# Patient Record
Sex: Female | Born: 1985 | Race: Black or African American | Hispanic: No | Marital: Single | Smoking: Never smoker
Health system: Southern US, Community
[De-identification: ages and names within clinical notes are randomized; demographics above are authoritative.]

## PROBLEM LIST (undated history)

## (undated) DIAGNOSIS — J45909 Unspecified asthma, uncomplicated: Secondary | ICD-10-CM

## (undated) DIAGNOSIS — F4310 Post-traumatic stress disorder, unspecified: Secondary | ICD-10-CM

## (undated) DIAGNOSIS — A749 Chlamydial infection, unspecified: Secondary | ICD-10-CM

## (undated) DIAGNOSIS — D649 Anemia, unspecified: Secondary | ICD-10-CM

## (undated) DIAGNOSIS — F329 Major depressive disorder, single episode, unspecified: Secondary | ICD-10-CM

## (undated) DIAGNOSIS — Z3483 Encounter for supervision of other normal pregnancy, third trimester: Secondary | ICD-10-CM

## (undated) DIAGNOSIS — G932 Benign intracranial hypertension: Secondary | ICD-10-CM

## (undated) DIAGNOSIS — Z8619 Personal history of other infectious and parasitic diseases: Secondary | ICD-10-CM

## (undated) DIAGNOSIS — F32A Depression, unspecified: Secondary | ICD-10-CM

## (undated) DIAGNOSIS — F419 Anxiety disorder, unspecified: Secondary | ICD-10-CM

## (undated) HISTORY — DX: Depression, unspecified: F32.A

## (undated) HISTORY — DX: Post-traumatic stress disorder, unspecified: F43.10

## (undated) HISTORY — DX: Major depressive disorder, single episode, unspecified: F32.9

## (undated) HISTORY — DX: Anxiety disorder, unspecified: F41.9

## (undated) HISTORY — PX: TONSILLECTOMY: SUR1361

## (undated) HISTORY — PX: WISDOM TOOTH EXTRACTION: SHX21

## (undated) HISTORY — DX: Personal history of other infectious and parasitic diseases: Z86.19

## (undated) HISTORY — DX: Anemia, unspecified: D64.9

## (undated) HISTORY — PX: DILATION AND CURETTAGE OF UTERUS: SHX78

---
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
## 2005-05-27 ENCOUNTER — Emergency Department: Payer: Self-pay | Admitting: Emergency Medicine

## 2005-08-02 ENCOUNTER — Emergency Department: Payer: Self-pay | Admitting: Emergency Medicine

## 2005-10-16 ENCOUNTER — Emergency Department (HOSPITAL_COMMUNITY): Admission: EM | Admit: 2005-10-16 | Discharge: 2005-10-16 | Payer: Self-pay | Admitting: Emergency Medicine

## 2005-11-26 IMAGING — CR DG CHEST 1V PORT
1 series · 1 of 1 positions shown · non-contrast
Comparison: none

REASON FOR EXAM: Shortness of breath
COMMENTS:

PROCEDURE:     DXR - DXR PORTABLE CHEST SINGLE VIEW  - November 25, 2004  [DATE]
RESULT:        The lungs are clear.  The heart and pulmonary vessels are
normal.  The bony and mediastinal structures are unremarkable.

[view not recorded]
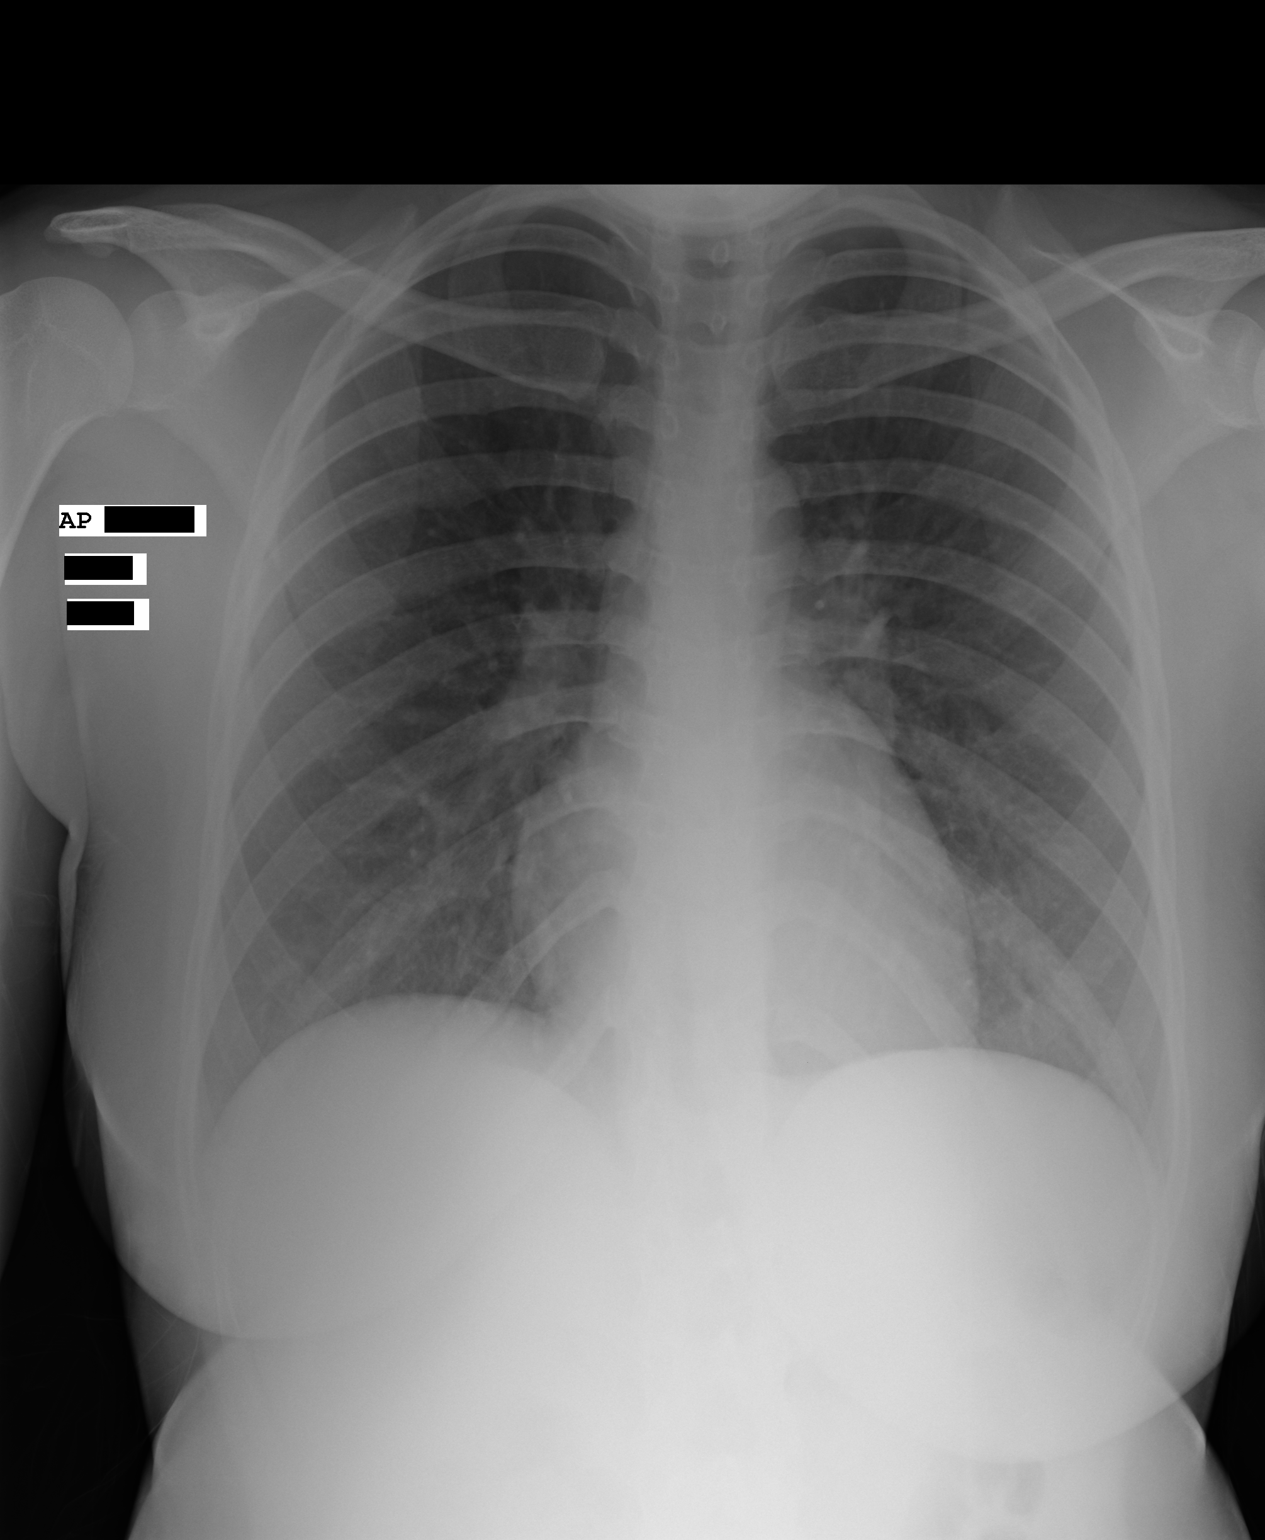

[1 of 1 positions shown; findings below may reference images not displayed]

IMPRESSION: No acute cardiopulmonary disease.

## 2005-12-10 ENCOUNTER — Emergency Department: Payer: Self-pay | Admitting: Emergency Medicine

## 2006-11-07 ENCOUNTER — Emergency Department: Payer: Self-pay | Admitting: Emergency Medicine

## 2007-01-15 ENCOUNTER — Emergency Department: Payer: Self-pay | Admitting: Emergency Medicine

## 2007-03-04 ENCOUNTER — Emergency Department: Payer: Self-pay | Admitting: Emergency Medicine

## 2007-06-08 ENCOUNTER — Emergency Department: Payer: Self-pay | Admitting: Emergency Medicine

## 2007-09-01 ENCOUNTER — Inpatient Hospital Stay (HOSPITAL_COMMUNITY): Admission: AD | Admit: 2007-09-01 | Discharge: 2007-09-02 | Payer: Self-pay | Admitting: Obstetrics and Gynecology

## 2007-12-04 ENCOUNTER — Inpatient Hospital Stay (HOSPITAL_COMMUNITY): Admission: AD | Admit: 2007-12-04 | Discharge: 2007-12-04 | Payer: Self-pay | Admitting: Obstetrics and Gynecology

## 2008-01-09 ENCOUNTER — Inpatient Hospital Stay (HOSPITAL_COMMUNITY): Admission: AD | Admit: 2008-01-09 | Discharge: 2008-01-09 | Payer: Self-pay | Admitting: Obstetrics and Gynecology

## 2008-01-21 ENCOUNTER — Inpatient Hospital Stay (HOSPITAL_COMMUNITY): Admission: AD | Admit: 2008-01-21 | Discharge: 2008-01-21 | Payer: Self-pay | Admitting: Obstetrics and Gynecology

## 2008-02-06 ENCOUNTER — Inpatient Hospital Stay (HOSPITAL_COMMUNITY): Admission: AD | Admit: 2008-02-06 | Discharge: 2008-02-06 | Payer: Self-pay | Admitting: Obstetrics and Gynecology

## 2008-03-15 ENCOUNTER — Inpatient Hospital Stay (HOSPITAL_COMMUNITY): Admission: AD | Admit: 2008-03-15 | Discharge: 2008-03-15 | Payer: Self-pay | Admitting: Obstetrics and Gynecology

## 2008-03-17 ENCOUNTER — Inpatient Hospital Stay (HOSPITAL_COMMUNITY): Admission: AD | Admit: 2008-03-17 | Discharge: 2008-03-18 | Payer: Self-pay | Admitting: Obstetrics and Gynecology

## 2008-03-20 ENCOUNTER — Inpatient Hospital Stay (HOSPITAL_COMMUNITY): Admission: AD | Admit: 2008-03-20 | Discharge: 2008-03-20 | Payer: Self-pay | Admitting: Obstetrics and Gynecology

## 2008-04-10 ENCOUNTER — Inpatient Hospital Stay (HOSPITAL_COMMUNITY): Admission: AD | Admit: 2008-04-10 | Discharge: 2008-04-12 | Payer: Self-pay | Admitting: Obstetrics and Gynecology

## 2008-06-30 ENCOUNTER — Emergency Department: Payer: Self-pay | Admitting: Emergency Medicine

## 2008-11-24 ENCOUNTER — Emergency Department: Payer: Self-pay | Admitting: Internal Medicine

## 2009-06-23 ENCOUNTER — Emergency Department: Payer: Self-pay | Admitting: Emergency Medicine

## 2009-07-15 ENCOUNTER — Emergency Department (HOSPITAL_COMMUNITY): Admission: EM | Admit: 2009-07-15 | Discharge: 2009-07-15 | Payer: Self-pay | Admitting: Emergency Medicine

## 2009-08-06 ENCOUNTER — Encounter (INDEPENDENT_AMBULATORY_CARE_PROVIDER_SITE_OTHER): Payer: Self-pay | Admitting: *Deleted

## 2009-09-03 ENCOUNTER — Emergency Department: Payer: Self-pay | Admitting: Emergency Medicine

## 2010-02-26 ENCOUNTER — Emergency Department: Payer: Self-pay | Admitting: Emergency Medicine

## 2010-03-10 NOTE — Assessment & Plan Note (Signed)
Summary: ER f/u & np,df    Other Orders: No Charge Patient Arrived (NCPA0) ([id number])

## 2010-04-27 LAB — CBC
HCT: 34.5 % — ABNORMAL LOW (ref 36.0–46.0)
Hemoglobin: 11.6 g/dL — ABNORMAL LOW (ref 12.0–15.0)
MCHC: 33.5 g/dL (ref 30.0–36.0)
MCV: 93.1 fL (ref 78.0–100.0)
Platelets: 226 10*3/uL (ref 150–400)
RBC: 3.71 MIL/uL — ABNORMAL LOW (ref 3.87–5.11)
RDW: 13.8 % (ref 11.5–15.5)
WBC: 6.7 10*3/uL (ref 4.0–10.5)

## 2010-04-27 LAB — URINALYSIS, ROUTINE W REFLEX MICROSCOPIC
Ketones, ur: NEGATIVE mg/dL
Nitrite: NEGATIVE
Protein, ur: NEGATIVE mg/dL
Urobilinogen, UA: 0.2 mg/dL (ref 0.0–1.0)
pH: 7 (ref 5.0–8.0)

## 2010-04-27 LAB — DIFFERENTIAL
Basophils Absolute: 0 10*3/uL (ref 0.0–0.1)
Basophils Relative: 0 % (ref 0–1)
Eosinophils Absolute: 0.1 10*3/uL (ref 0.0–0.7)
Eosinophils Relative: 2 % (ref 0–5)
Lymphocytes Relative: 24 % (ref 12–46)
Lymphs Abs: 1.6 10*3/uL (ref 0.7–4.0)
Monocytes Absolute: 0.4 10*3/uL (ref 0.1–1.0)
Monocytes Relative: 6 % (ref 3–12)
Neutro Abs: 4.6 10*3/uL (ref 1.7–7.7)
Neutrophils Relative %: 68 % (ref 43–77)

## 2010-04-27 LAB — BASIC METABOLIC PANEL
BUN: 11 mg/dL (ref 6–23)
CO2: 25 mEq/L (ref 19–32)
Calcium: 8.9 mg/dL (ref 8.4–10.5)
Chloride: 106 mEq/L (ref 96–112)
Creatinine, Ser: 0.8 mg/dL (ref 0.4–1.2)
GFR calc Af Amer: >60 mL/min (ref >60)
GFR calc non Af Amer: >60 mL/min (ref >60)
Glucose, Bld: 89 mg/dL (ref 70–99)
Potassium: 3.9 mEq/L (ref 3.5–5.1)
Sodium: 137 mEq/L (ref 135–145)

## 2010-04-27 LAB — BRAIN NATRIURETIC PEPTIDE: Pro B Natriuretic peptide (BNP): <30 pg/mL (ref 0.0–100.0)

## 2010-04-27 LAB — D-DIMER, QUANTITATIVE: D-Dimer, Quant: <0.22 ug/mL-FEU (ref 0.00–0.48)

## 2010-05-21 LAB — CBC
HCT: 35.4 % — ABNORMAL LOW (ref 36.0–46.0)
Hemoglobin: 11.6 g/dL — ABNORMAL LOW (ref 12.0–15.0)
Hemoglobin: 9.5 g/dL — ABNORMAL LOW (ref 12.0–15.0)
MCHC: 32.7 g/dL (ref 30.0–36.0)
MCV: 91.8 fL (ref 78.0–100.0)
MCV: 93.9 fL (ref 78.0–100.0)
Platelets: 210 10*3/uL (ref 150–400)
RDW: 14.6 % (ref 11.5–15.5)
WBC: 14.5 10*3/uL — ABNORMAL HIGH (ref 4.0–10.5)

## 2010-06-23 NOTE — H&P (Signed)
[name], [name]           ACCOUNT NO.:  [id number]   MEDICAL RECORD NO.:  [id number]          PATIENT TYPE:  INP   LOCATION:  9198                          FACILITY:  [facility]   PHYSICIAN:  Crist Fat. Rivard, M.D. DATE OF BIRTH:  1985/05/30   DATE OF ADMISSION:  04/10/2008  DATE OF DISCHARGE:                              HISTORY & PHYSICAL   NO DICTATION      Vicki L. Emilee Hero, C.N.M.      Crist Fat Rivard, M.D.     Leeanne Mannan  D:  04/10/2008  T:  04/10/2008  Job:  [id number]
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
## 2010-09-07 ENCOUNTER — Ambulatory Visit: Payer: Self-pay | Admitting: Family Medicine

## 2010-10-08 ENCOUNTER — Emergency Department: Payer: Self-pay | Admitting: Emergency Medicine

## 2010-11-06 LAB — WET PREP, GENITAL: Yeast Wet Prep HPF POC: NONE SEEN

## 2010-11-06 LAB — URINE MICROSCOPIC-ADD ON

## 2010-11-06 LAB — URINALYSIS, ROUTINE W REFLEX MICROSCOPIC
Bilirubin Urine: NEGATIVE
Glucose, UA: NEGATIVE
Hgb urine dipstick: NEGATIVE
Ketones, ur: 15 — AB
Protein, ur: NEGATIVE
Urobilinogen, UA: 1

## 2010-11-06 LAB — HCG, QUANTITATIVE, PREGNANCY: hCG, Beta Chain, Quant, S: 147868 — ABNORMAL HIGH

## 2010-11-06 LAB — URINE CULTURE: Colony Count: >=100000

## 2010-11-06 LAB — ABO/RH: ABO/RH(D): A POS

## 2010-11-06 LAB — GC/CHLAMYDIA PROBE AMP, GENITAL
Chlamydia, DNA Probe: POSITIVE — AB
GC Probe Amp, Genital: NEGATIVE

## 2010-11-10 LAB — CBC
MCV: 94.9
Platelets: 251
RBC: 3.38 — ABNORMAL LOW
WBC: 10.9 — ABNORMAL HIGH

## 2010-11-10 LAB — DIFFERENTIAL
Basophils Relative: 0
Eosinophils Absolute: 0.1
Lymphs Abs: 1.1
Monocytes Relative: 5
Neutro Abs: 9 — ABNORMAL HIGH
Neutrophils Relative %: 83 — ABNORMAL HIGH

## 2010-11-13 LAB — STREP B DNA PROBE
Strep Group B Ag: POSITIVE
Strep Group B Ag: POSITIVE

## 2010-11-13 LAB — URINALYSIS, ROUTINE W REFLEX MICROSCOPIC
Bilirubin Urine: NEGATIVE
Glucose, UA: NEGATIVE mg/dL
Glucose, UA: NEGATIVE mg/dL
Hgb urine dipstick: NEGATIVE
Hgb urine dipstick: NEGATIVE
Ketones, ur: NEGATIVE mg/dL
Protein, ur: NEGATIVE mg/dL
Protein, ur: NEGATIVE mg/dL
Urobilinogen, UA: 0.2 mg/dL (ref 0.0–1.0)
Urobilinogen, UA: 0.2 mg/dL (ref 0.0–1.0)

## 2010-11-13 LAB — CBC
HCT: 30.7 % — ABNORMAL LOW (ref 36.0–46.0)
MCHC: 34.1 g/dL (ref 30.0–36.0)
MCV: 93.9 fL (ref 78.0–100.0)
Platelets: 240 10*3/uL (ref 150–400)
WBC: 10.1 10*3/uL (ref 4.0–10.5)

## 2010-11-13 LAB — COMPREHENSIVE METABOLIC PANEL
AST: 19 U/L (ref 0–37)
Albumin: 3 g/dL — ABNORMAL LOW (ref 3.5–5.2)
BUN: 3 mg/dL — ABNORMAL LOW (ref 6–23)
CO2: 24 mEq/L (ref 19–32)
Calcium: 8.5 mg/dL (ref 8.4–10.5)
Chloride: 105 mEq/L (ref 96–112)
Creatinine, Ser: 0.6 mg/dL (ref 0.4–1.2)
GFR calc Af Amer: >60 mL/min (ref >60)
GFR calc non Af Amer: >60 mL/min (ref >60)
Total Bilirubin: 0.4 mg/dL (ref 0.3–1.2)

## 2010-11-13 LAB — WET PREP, GENITAL
Clue Cells Wet Prep HPF POC: NONE SEEN
Trich, Wet Prep: NONE SEEN

## 2010-11-13 LAB — GC/CHLAMYDIA PROBE AMP, GENITAL
Chlamydia, DNA Probe: POSITIVE — AB
GC Probe Amp, Genital: NEGATIVE

## 2010-12-08 ENCOUNTER — Emergency Department: Payer: Self-pay | Admitting: Emergency Medicine

## 2011-06-26 ENCOUNTER — Emergency Department: Payer: Self-pay | Admitting: Emergency Medicine

## 2011-06-27 LAB — URINALYSIS, COMPLETE
Bilirubin,UR: NEGATIVE
Ketone: NEGATIVE
Ph: 7 (ref 4.5–8.0)
Protein: 30
Specific Gravity: 1.028 (ref 1.003–1.030)
WBC UR: 3 /HPF (ref 0–5)

## 2011-06-27 LAB — CBC
HCT: 37 % (ref 35.0–47.0)
HGB: 12.2 g/dL (ref 12.0–16.0)
MCH: 31.1 pg (ref 26.0–34.0)
MCV: 94 fL (ref 80–100)
RBC: 3.92 10*6/uL (ref 3.80–5.20)
RDW: 12.8 % (ref 11.5–14.5)

## 2011-09-03 ENCOUNTER — Ambulatory Visit: Payer: Self-pay | Admitting: Family Medicine

## 2012-04-30 ENCOUNTER — Emergency Department: Payer: Self-pay | Admitting: Emergency Medicine

## 2012-05-31 ENCOUNTER — Emergency Department: Payer: Self-pay | Admitting: Emergency Medicine

## 2012-06-21 ENCOUNTER — Emergency Department: Payer: Self-pay | Admitting: Internal Medicine

## 2012-06-21 LAB — URINALYSIS, COMPLETE
Bilirubin,UR: NEGATIVE
Ketone: NEGATIVE
Nitrite: NEGATIVE
Ph: 6 (ref 4.5–8.0)
RBC,UR: 24 /HPF (ref 0–5)
Squamous Epithelial: 34

## 2012-06-21 LAB — RAPID INFLUENZA A&B ANTIGENS

## 2012-12-29 ENCOUNTER — Encounter (HOSPITAL_COMMUNITY): Payer: Self-pay | Admitting: Emergency Medicine

## 2012-12-29 ENCOUNTER — Emergency Department (HOSPITAL_COMMUNITY)
Admission: EM | Admit: 2012-12-29 | Discharge: 2012-12-29 | Disposition: A | Discharge status: Home / Self Care | Payer: Medicaid Other | Attending: Emergency Medicine | Admitting: Emergency Medicine

## 2012-12-29 DIAGNOSIS — Z3202 Encounter for pregnancy test, result negative: Secondary | ICD-10-CM | POA: Insufficient documentation

## 2012-12-29 DIAGNOSIS — H9209 Otalgia, unspecified ear: Secondary | ICD-10-CM | POA: Insufficient documentation

## 2012-12-29 DIAGNOSIS — J029 Acute pharyngitis, unspecified: Secondary | ICD-10-CM | POA: Insufficient documentation

## 2012-12-29 DIAGNOSIS — N898 Other specified noninflammatory disorders of vagina: Secondary | ICD-10-CM

## 2012-12-29 DIAGNOSIS — J069 Acute upper respiratory infection, unspecified: Secondary | ICD-10-CM | POA: Insufficient documentation

## 2012-12-29 DIAGNOSIS — Z8739 Personal history of other diseases of the musculoskeletal system and connective tissue: Secondary | ICD-10-CM | POA: Insufficient documentation

## 2012-12-29 LAB — URINALYSIS, ROUTINE W REFLEX MICROSCOPIC
Bilirubin Urine: NEGATIVE
Glucose, UA: NEGATIVE mg/dL
Hgb urine dipstick: NEGATIVE
Ketones, ur: NEGATIVE mg/dL
Nitrite: NEGATIVE
Specific Gravity, Urine: 1.031 — ABNORMAL HIGH (ref 1.005–1.030)
pH: 7 (ref 5.0–8.0)

## 2012-12-29 LAB — WET PREP, GENITAL
Clue Cells Wet Prep HPF POC: NONE SEEN
Trich, Wet Prep: NONE SEEN
Yeast Wet Prep HPF POC: NONE SEEN

## 2012-12-29 MED ORDER — CEFTRIAXONE SODIUM 250 MG IJ SOLR
250.0000 mg | Freq: Once | INTRAMUSCULAR | Status: AC
  Administered 2012-12-29: 250 mg via INTRAMUSCULAR
  Filled 2012-12-29: qty 250

## 2012-12-29 MED ORDER — PSEUDOEPHEDRINE HCL ER 120 MG PO TB12: 120.0000 mg | ORAL_TABLET | Freq: Two times a day (BID) | ORAL | Status: DC

## 2012-12-29 MED ORDER — AZITHROMYCIN 250 MG PO TABS
1000.0000 mg | ORAL_TABLET | Freq: Once | ORAL | Status: AC
  Administered 2012-12-29: 1000 mg via ORAL
  Filled 2012-12-29: qty 4

## 2012-12-29 MED ORDER — LIDOCAINE HCL 1 % IJ SOLN
INTRAMUSCULAR | Status: AC
  Filled 2012-12-29: qty 20
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
## 2012-12-29 NOTE — Progress Notes (Signed)
Patient confirms her pcp is Dr. Liane Comber.  System updated.

## 2012-12-29 NOTE — ED Notes (Signed)
Pt states she thinks she came in contact with STD. Wants to be checked for chlamydia. Vaginal discharge is yellow/white. No abdominal pain. Also c/o sore throat, right ear pain, cough.

## 2012-12-30 LAB — GC/CHLAMYDIA PROBE AMP: GC Probe RNA: NEGATIVE

## 2013-01-01 NOTE — ED Provider Notes (Signed)
Medical screening examination/treatment/procedure(s) were performed by non-physician practitioner and as supervising physician I was immediately available for consultation/collaboration.   [name], MD 01/01/13 [phone]

## 2013-06-02 ENCOUNTER — Emergency Department (HOSPITAL_COMMUNITY)
Admission: EM | Admit: 2013-06-02 | Discharge: 2013-06-02 | Disposition: A | Discharge status: Home / Self Care | Payer: Medicaid Other | Attending: Emergency Medicine | Admitting: Emergency Medicine

## 2013-06-02 ENCOUNTER — Encounter (HOSPITAL_COMMUNITY): Payer: Self-pay | Admitting: Emergency Medicine

## 2013-06-02 DIAGNOSIS — Z8739 Personal history of other diseases of the musculoskeletal system and connective tissue: Secondary | ICD-10-CM | POA: Insufficient documentation

## 2013-06-02 DIAGNOSIS — R110 Nausea: Secondary | ICD-10-CM | POA: Insufficient documentation

## 2013-06-02 DIAGNOSIS — R52 Pain, unspecified: Secondary | ICD-10-CM | POA: Insufficient documentation

## 2013-06-02 DIAGNOSIS — Z331 Pregnant state, incidental: Secondary | ICD-10-CM

## 2013-06-02 DIAGNOSIS — J329 Chronic sinusitis, unspecified: Secondary | ICD-10-CM

## 2013-06-02 LAB — POC URINE PREG, ED: Preg Test, Ur: POSITIVE — AB

## 2013-06-02 MED ORDER — ACETAMINOPHEN 500 MG PO TABS: 500.0000 mg | ORAL_TABLET | Freq: Four times a day (QID) | ORAL | Status: DC | PRN

## 2013-06-02 MED ORDER — ONDANSETRON HCL 4 MG PO TABS: 4.0000 mg | ORAL_TABLET | Freq: Four times a day (QID) | ORAL | Status: DC

## 2013-06-02 MED ORDER — FLUTICASONE PROPIONATE 50 MCG/ACT NA SUSP: 2.0000 | Freq: Every day | NASAL | Status: DC

## 2013-06-02 NOTE — ED Notes (Signed)
Pt c/o nasal drainage, sore throat, body aches, chills, nausea.

## 2013-06-02 NOTE — ED Provider Notes (Signed)
CSN: [id number]     Arrival date & time 06/02/13  0143 History   First MD Initiated Contact with Patient 06/02/13 [phone]     Chief Complaint  Patient presents with  . Sore Throat  . Nasal Congestion  . Generalized Body Aches  . Nausea    (Consider location/radiation/quality/duration/timing/severity/associated sxs/prior Treatment) Patient is a 28 y.o. female presenting with URI. The history is provided by the patient. No language interpreter was used.  URI Presenting symptoms: congestion, rhinorrhea and sore throat   Presenting symptoms: no ear pain and no fever   Severity:  Moderate Onset quality:  Gradual Timing:  Constant Progression:  Worsening Chronicity:  New Relieved by:  Nothing Ineffective treatments:  Hot fluids, OTC medications and rest Associated symptoms: myalgias   Risk factors: no recent travel and no sick contacts     Past Medical History  Diagnosis Date  . Arthritis    Past Surgical History  Procedure Laterality Date  . Tonsillectomy     No family history on file. History  Substance Use Topics  . Smoking status: Never Smoker   . Smokeless tobacco: Not on file  . Alcohol Use: Yes     Comment: occ   OB History   Grav Para Term Preterm Abortions TAB SAB Ect Mult Living                   Review of Systems  Constitutional: Positive for chills. Negative for fever.  HENT: Positive for congestion, postnasal drip, rhinorrhea, sinus pressure and sore throat. Negative for ear discharge, ear pain and trouble swallowing.   Respiratory: Negative for shortness of breath.   Gastrointestinal: Positive for nausea. Negative for abdominal pain.  Genitourinary: Negative for vaginal bleeding and vaginal discharge.  Musculoskeletal: Positive for myalgias. Negative for neck stiffness.  Neurological: Negative for syncope.  All other systems reviewed and are negative.    Allergies  Review of patient's allergies indicates no known allergies.  Home Medications    Prior to Admission medications   Not on File   BP 148/97  Pulse 84  Temp(Src) 98.9 F (37.2 C) (Oral)  Resp 18  Ht 5\' 4"  (1.626 m)  SpO2 99%  LMP 05/01/2013  Physical Exam  Nursing note and vitals reviewed. Constitutional: She is oriented to person, place, and time. She appears well-developed and well-nourished. No distress.  Nontoxic/nonseptic appearing  HENT:  Head: Normocephalic and atraumatic.  Right Ear: Hearing, tympanic membrane, external ear and ear canal normal. No mastoid tenderness.  Left Ear: Hearing, tympanic membrane, external ear and ear canal normal. No mastoid tenderness.  Nose: No rhinorrhea. Right sinus exhibits maxillary sinus tenderness and frontal sinus tenderness. Left sinus exhibits maxillary sinus tenderness and frontal sinus tenderness.  Mouth/Throat: Uvula is midline and mucous membranes are normal. Mucous membranes are not dry. No oral lesions. No trismus in the jaw. No uvula swelling. Posterior oropharyngeal erythema (mild) present. No oropharyngeal exudate, posterior oropharyngeal edema or tonsillar abscesses.  +Audible congestion without rhinorrhea  Eyes: Conjunctivae and EOM are normal. No scleral icterus.  Neck: Normal range of motion. Neck supple.  No nuchal rigidity or meningismus  Cardiovascular: Normal rate, regular rhythm and normal heart sounds.   Pulmonary/Chest: Effort normal. No respiratory distress. She has no wheezes. She has no rales.  No tachypnea or dyspnea  Abdominal: Soft. She exhibits no distension. There is no tenderness.  Soft and nontender  Musculoskeletal: Normal range of motion.  Neurological: She is alert and oriented to person, place,  and time.  GCS 15. Speech goal oriented. Moves extremities without ataxia.  Skin: Skin is warm and dry. No rash noted. She is not diaphoretic. No erythema. No pallor.  Psychiatric: She has a normal mood and affect. Her behavior is normal.    ED Course  Procedures (including critical care  time) Labs Review Labs Reviewed  POC URINE PREG, ED - Abnormal; Notable for the following:    Preg Test, Ur POSITIVE (*)    All other components within normal limits    Imaging Review No results found.   EKG Interpretation None      MDM   Final diagnoses:  Sinusitis  Pregnancy as incidental finding    Patient complaining of symptoms of sinusitis. Mild to moderate symptoms of clear/yellow nasal discharge/congestion and scratchy throat with chills and body aches for less than 10 days. Patient is afebrile and nontoxic with stable vital signs. No concern for acute bacterial rhinosinusitis; likely viral in nature. Patient also requested Upreg be completed prior to determining management plan; patient concerned she is pregnant and did not want to take anything harmful should her test be positive. Upreg today is positive. Patient denies abdominal pain, vaginal bleeding, or d/c. Patient discharged with symptomatic treatments safe in pregnancy. Patient instructions given for warm saline nasal washes. Recommendations for follow-up with primary care physician and OBGYN. Patient agreeable to plan with no unaddressed concerns.  Filed Vitals:   06/02/13 0224 06/02/13 0600  BP: 148/97 140/84  Pulse: 84 74  Temp: 98.9 F (37.2 C)   TempSrc: Oral   Resp: 18 18  Height: 5\' 4"  (1.626 m)   SpO2: 99% 99%          Antony MaduraKelly Lalana Wachter, PA-C 06/11/13 2149

## 2013-06-02 NOTE — Discharge Instructions (Signed)
Recommend medications prescribed every for symptomatic management. Also recommend nasal saline sprays or saline rinses for congestion. Follow up with your OB/GYN as your urine pregnancy today was positive. Follow up with your primary care provider as needed to ensure the symptoms resolve. Return if symptoms worsen or if you experience prolonged episodes of sharp abdominal pain, vaginal bleeding, or fever.  Sinusitis Sinusitis is redness, soreness, and swelling (inflammation) of the paranasal sinuses. Paranasal sinuses are air pockets within the bones of your face (beneath the eyes, the middle of the forehead, or above the eyes). In healthy paranasal sinuses, mucus is able to drain out, and air is able to circulate through them by way of your nose. However, when your paranasal sinuses are inflamed, mucus and air can become trapped. This can allow bacteria and other germs to grow and cause infection. Sinusitis can develop quickly and last only a short time (acute) or continue over a long period (chronic). Sinusitis that lasts for more than 12 weeks is considered chronic.  CAUSES  Causes of sinusitis include:  Allergies.  Structural abnormalities, such as displacement of the cartilage that separates your nostrils (deviated septum), which can decrease the air flow through your nose and sinuses and affect sinus drainage.  Functional abnormalities, such as when the small hairs (cilia) that line your sinuses and help remove mucus do not work properly or are not present. SYMPTOMS  Symptoms of acute and chronic sinusitis are the same. The primary symptoms are pain and pressure around the affected sinuses. Other symptoms include:  Upper toothache.  Earache.  Headache.  Bad breath.  Decreased sense of smell and taste.  A cough, which worsens when you are lying flat.  Fatigue.  Fever.  Thick drainage from your nose, which often is green and may contain pus (purulent).  Swelling and warmth over  the affected sinuses. DIAGNOSIS  Your caregiver will perform a physical exam. During the exam, your caregiver may:  Look in your nose for signs of abnormal growths in your nostrils (nasal polyps).  Tap over the affected sinus to check for signs of infection.  View the inside of your sinuses (endoscopy) with a special imaging device with a light attached (endoscope), which is inserted into your sinuses. If your caregiver suspects that you have chronic sinusitis, one or more of the following tests may be recommended:  Allergy tests.  Nasal culture A sample of mucus is taken from your nose and sent to a lab and screened for bacteria.  Nasal cytology A sample of mucus is taken from your nose and examined by your caregiver to determine if your sinusitis is related to an allergy. TREATMENT  Most cases of acute sinusitis are related to a viral infection and will resolve on their own within 10 days. Sometimes medicines are prescribed to help relieve symptoms (pain medicine, decongestants, nasal steroid sprays, or saline sprays).  However, for sinusitis related to a bacterial infection, your caregiver will prescribe antibiotic medicines. These are medicines that will help kill the bacteria causing the infection.  Rarely, sinusitis is caused by a fungal infection. In theses cases, your caregiver will prescribe antifungal medicine. For some cases of chronic sinusitis, surgery is needed. Generally, these are cases in which sinusitis recurs more than 3 times per year, despite other treatments. HOME CARE INSTRUCTIONS   Drink plenty of water. Water helps thin the mucus so your sinuses can drain more easily.  Use a humidifier.  Inhale steam 3 to 4 times a day (for  example, sit in the bathroom with the shower running).  Apply a warm, moist washcloth to your face 3 to 4 times a day, or as directed by your caregiver.  Use saline nasal sprays to help moisten and clean your sinuses.  Take  over-the-counter or prescription medicines for pain, discomfort, or fever only as directed by your caregiver. SEEK IMMEDIATE MEDICAL CARE IF:  You have increasing pain or severe headaches.  You have nausea, vomiting, or drowsiness.  You have swelling around your face.  You have vision problems.  You have a stiff neck.  You have difficulty breathing. MAKE SURE YOU:   Understand these instructions.  Will watch your condition.  Will get help right away if you are not doing well or get worse. Document Released: 01/25/2005 Document Revised: 04/19/2011 Document Reviewed: 02/09/2011 [facility] Patient Information 2014 [place], [place].

## 2013-06-15 NOTE — ED Provider Notes (Signed)
Medical screening examination/treatment/procedure(s) were performed by non-physician practitioner and as supervising physician I was immediately available for consultation/collaboration.   EKG Interpretation None        Enid SkeensJoshua M Brinleigh Tew, MD 06/15/13 1104

## 2013-08-24 ENCOUNTER — Encounter (HOSPITAL_COMMUNITY): Payer: Self-pay | Admitting: Emergency Medicine

## 2013-08-24 ENCOUNTER — Emergency Department (HOSPITAL_COMMUNITY)
Admission: EM | Admit: 2013-08-24 | Discharge: 2013-08-24 | Disposition: A | Discharge status: Home / Self Care | Payer: 59 | Attending: Emergency Medicine | Admitting: Emergency Medicine

## 2013-08-24 DIAGNOSIS — F411 Generalized anxiety disorder: Secondary | ICD-10-CM | POA: Insufficient documentation

## 2013-08-24 DIAGNOSIS — Z792 Long term (current) use of antibiotics: Secondary | ICD-10-CM | POA: Diagnosis not present

## 2013-08-24 DIAGNOSIS — R51 Headache: Secondary | ICD-10-CM | POA: Diagnosis present

## 2013-08-24 DIAGNOSIS — F3289 Other specified depressive episodes: Secondary | ICD-10-CM | POA: Diagnosis not present

## 2013-08-24 DIAGNOSIS — IMO0002 Reserved for concepts with insufficient information to code with codable children: Secondary | ICD-10-CM | POA: Diagnosis not present

## 2013-08-24 DIAGNOSIS — R197 Diarrhea, unspecified: Secondary | ICD-10-CM | POA: Diagnosis not present

## 2013-08-24 DIAGNOSIS — F430 Acute stress reaction: Secondary | ICD-10-CM | POA: Insufficient documentation

## 2013-08-24 DIAGNOSIS — J45901 Unspecified asthma with (acute) exacerbation: Secondary | ICD-10-CM | POA: Diagnosis not present

## 2013-08-24 DIAGNOSIS — Z3202 Encounter for pregnancy test, result negative: Secondary | ICD-10-CM | POA: Insufficient documentation

## 2013-08-24 DIAGNOSIS — F329 Major depressive disorder, single episode, unspecified: Secondary | ICD-10-CM | POA: Diagnosis not present

## 2013-08-24 DIAGNOSIS — R110 Nausea: Secondary | ICD-10-CM | POA: Diagnosis not present

## 2013-08-24 DIAGNOSIS — N898 Other specified noninflammatory disorders of vagina: Secondary | ICD-10-CM | POA: Diagnosis not present

## 2013-08-24 DIAGNOSIS — F439 Reaction to severe stress, unspecified: Secondary | ICD-10-CM

## 2013-08-24 DIAGNOSIS — N390 Urinary tract infection, site not specified: Secondary | ICD-10-CM | POA: Diagnosis not present

## 2013-08-24 HISTORY — DX: Unspecified asthma, uncomplicated: J45.909

## 2013-08-24 LAB — WET PREP, GENITAL
CLUE CELLS WET PREP: NONE SEEN
TRICH WET PREP: NONE SEEN
WBC, Wet Prep HPF POC: NONE SEEN
Yeast Wet Prep HPF POC: NONE SEEN

## 2013-08-24 LAB — COMPREHENSIVE METABOLIC PANEL
ALT: 11 U/L (ref 0–35)
AST: 18 U/L (ref 0–37)
Albumin: 4.6 g/dL (ref 3.5–5.2)
Alkaline Phosphatase: 65 U/L (ref 39–117)
Anion gap: 16 — ABNORMAL HIGH (ref 5–15)
BUN: 11 mg/dL (ref 6–23)
CALCIUM: 9.5 mg/dL (ref 8.4–10.5)
CO2: 22 mEq/L (ref 19–32)
CREATININE: 0.94 mg/dL (ref 0.50–1.10)
Chloride: 101 mEq/L (ref 96–112)
GFR calc non Af Amer: 82 mL/min — ABNORMAL LOW (ref >90)
GLUCOSE: 81 mg/dL (ref 70–99)
Potassium: 3.8 mEq/L (ref 3.7–5.3)
SODIUM: 139 meq/L (ref 137–147)
TOTAL PROTEIN: 8.4 g/dL — AB (ref 6.0–8.3)
Total Bilirubin: 0.8 mg/dL (ref 0.3–1.2)

## 2013-08-24 LAB — URINALYSIS, ROUTINE W REFLEX MICROSCOPIC
Bilirubin Urine: NEGATIVE
Glucose, UA: NEGATIVE mg/dL
Ketones, ur: 15 mg/dL — AB
NITRITE: POSITIVE — AB
PROTEIN: 30 mg/dL — AB
SPECIFIC GRAVITY, URINE: 1.028 (ref 1.005–1.030)
UROBILINOGEN UA: 1 mg/dL (ref 0.0–1.0)
pH: 6 (ref 5.0–8.0)

## 2013-08-24 LAB — CBC
HCT: 39 % (ref 36.0–46.0)
HEMOGLOBIN: 12.8 g/dL (ref 12.0–15.0)
MCH: 30.3 pg (ref 26.0–34.0)
MCHC: 32.8 g/dL (ref 30.0–36.0)
MCV: 92.2 fL (ref 78.0–100.0)
PLATELETS: 307 10*3/uL (ref 150–400)
RBC: 4.23 MIL/uL (ref 3.87–5.11)
RDW: 12.4 % (ref 11.5–15.5)
WBC: 9.9 10*3/uL (ref 4.0–10.5)

## 2013-08-24 LAB — URINE MICROSCOPIC-ADD ON

## 2013-08-24 MED ORDER — ONDANSETRON HCL 4 MG PO TABS: 4.0000 mg | ORAL_TABLET | Freq: Four times a day (QID) | ORAL | Status: DC

## 2013-08-24 MED ORDER — CEPHALEXIN 500 MG PO CAPS: 500.0000 mg | ORAL_CAPSULE | Freq: Four times a day (QID) | ORAL | Status: DC

## 2013-08-24 MED ORDER — SODIUM CHLORIDE 0.9 % IV BOLUS (SEPSIS)
500.0000 mL | Freq: Once | INTRAVENOUS | Status: AC
  Administered 2013-08-24: 500 mL via INTRAVENOUS

## 2013-08-24 MED ORDER — LORAZEPAM 2 MG/ML IJ SOLN
1.0000 mg | Freq: Once | INTRAMUSCULAR | Status: AC
  Administered 2013-08-24: 1 mg via INTRAVENOUS
  Filled 2013-08-24: qty 1

## 2013-08-24 NOTE — Discharge Instructions (Signed)
Take antibiotic to completion for urinary tract infection. Take Zofran as directed as needed for nausea. Followup with your primary care physician to discuss your stress, anxiety and depression. Depression, Adult Depression refers to feeling sad, low, down in the dumps, blue, gloomy, or empty. In general, there are two kinds of depression: 1. Depression that we all experience from time to time because of upsetting life experiences, including the loss of a job or the ending of a relationship (normal sadness or normal grief). This kind of depression is considered normal, is short lived, and resolves within a few days to 2 weeks. (Depression experienced after the loss of a loved one is called bereavement. Bereavement often lasts longer than 2 weeks but normally gets better with time.) 2. Clinical depression, which lasts longer than normal sadness or normal grief or interferes with your ability to function at home, at work, and in school. It also interferes with your personal relationships. It affects almost every aspect of your life. Clinical depression is an illness. Symptoms of depression also can be caused by conditions other than normal sadness and grief or clinical depression. Examples of these conditions are listed as follows:  Physical illness--Some physical illnesses, including underactive thyroid gland (hypothyroidism), severe anemia, specific types of cancer, diabetes, uncontrolled seizures, heart and lung problems, strokes, and chronic pain are commonly associated with symptoms of depression.  Side effects of some prescription medicine--In some people, certain types of prescription medicine can cause symptoms of depression.  Substance abuse--Abuse of alcohol and illicit drugs can cause symptoms of depression. SYMPTOMS Symptoms of normal sadness and normal grief include the following:  Feeling sad or crying for short periods of time.  Not caring about anything (apathy).  Difficulty sleeping  or sleeping too much.  No longer able to enjoy the things you used to enjoy.  Desire to be by oneself all the time (social isolation).  Lack of energy or motivation.  Difficulty concentrating or remembering.  Change in appetite or weight.  Restlessness or agitation. Symptoms of clinical depression include the same symptoms of normal sadness or normal grief and also the following symptoms:  Feeling sad or crying all the time.  Feelings of guilt or worthlessness.  Feelings of hopelessness or helplessness.  Thoughts of suicide or the desire to harm yourself (suicidal ideation).  Loss of touch with reality (psychotic symptoms). Seeing or hearing things that are not real (hallucinations) or having false beliefs about your life or the people around you (delusions and paranoia). DIAGNOSIS  The diagnosis of clinical depression usually is based on the severity and duration of the symptoms. Your caregiver also will ask you questions about your medical history and substance use to find out if physical illness, use of prescription medicine, or substance abuse is causing your depression. Your caregiver also may order blood tests. TREATMENT  Typically, normal sadness and normal grief do not require treatment. However, sometimes antidepressant medicine is prescribed for bereavement to ease the depressive symptoms until they resolve. The treatment for clinical depression depends on the severity of your symptoms but typically includes antidepressant medicine, counseling with a mental health professional, or a combination of both. Your caregiver will help to determine what treatment is best for you. Depression caused by physical illness usually goes away with appropriate medical treatment of the illness. If prescription medicine is causing depression, talk with your caregiver about stopping the medicine, decreasing the dose, or substituting another medicine. Depression caused by abuse of alcohol or  illicit drugs abuse  goes away with abstinence from these substances. Some adults need professional help in order to stop drinking or using drugs. SEEK IMMEDIATE CARE IF:  You have thoughts about hurting yourself or others.  You lose touch with reality (have psychotic symptoms).  You are taking medicine for depression and have a serious side effect. FOR MORE INFORMATION National Alliance on Mental Illness: www.nami.[employer] National Institute of Mental Health: http://www.maynard.net/www.nimh.nih.gov Document Released: 01/23/2000 Document Revised: 07/27/2011 Document Reviewed: 04/26/2011 [facility] Patient Information 2015 [place], [place]. This information is not intended to replace advice given to you by your health care provider. Make sure you discuss any questions you have with your health care provider. Urinary Tract Infection Urinary tract infections (UTIs) can develop anywhere along your urinary tract. Your urinary tract is your body's drainage system for removing wastes and extra water. Your urinary tract includes two kidneys, two ureters, a bladder, and a urethra. Your kidneys are a pair of bean-shaped organs. Each kidney is about the size of your fist. They are located below your ribs, one on each side of your spine. CAUSES Infections are caused by microbes, which are microscopic organisms, including fungi, viruses, and bacteria. These organisms are so small that they can only be seen through a microscope. Bacteria are the microbes that most commonly cause UTIs. SYMPTOMS  Symptoms of UTIs may vary by age and gender of the patient and by the location of the infection. Symptoms in young women typically include a frequent and intense urge to urinate and a painful, burning feeling in the bladder or urethra during urination. Older women and men are more likely to be tired, shaky, and weak and have muscle aches and abdominal pain. A fever may mean the infection is in your kidneys. Other symptoms of a kidney infection include  pain in your back or sides below the ribs, nausea, and vomiting. DIAGNOSIS To diagnose a UTI, your caregiver will ask you about your symptoms. Your caregiver also will ask to provide a urine sample. The urine sample will be tested for bacteria and white blood cells. White blood cells are made by your body to help fight infection. TREATMENT  Typically, UTIs can be treated with medication. Because most UTIs are caused by a bacterial infection, they usually can be treated with the use of antibiotics. The choice of antibiotic and length of treatment depend on your symptoms and the type of bacteria causing your infection. HOME CARE INSTRUCTIONS  If you were prescribed antibiotics, take them exactly as your caregiver instructs you. Finish the medication even if you feel better after you have only taken some of the medication.  Drink enough water and fluids to keep your urine clear or pale yellow.  Avoid caffeine, tea, and carbonated beverages. They tend to irritate your bladder.  Empty your bladder often. Avoid holding urine for long periods of time.  Empty your bladder before and after sexual intercourse.  After a bowel movement, women should cleanse from front to back. Use each tissue only once. SEEK MEDICAL CARE IF:   You have back pain.  You develop a fever.  Your symptoms do not begin to resolve within 3 days. SEEK IMMEDIATE MEDICAL CARE IF:   You have severe back pain or lower abdominal pain.  You develop chills.  You have nausea or vomiting.  You have continued burning or discomfort with urination. MAKE SURE YOU:   Understand these instructions.  Will watch your condition.  Will get help right away if you are not doing well  or get worse. Document Released: 11/04/2004 Document Revised: 07/27/2011 Document Reviewed: 03/05/2011 [facility] Patient Information 2015 [place], [place]. This information is not intended to replace advice given to you by your health care provider. Make  sure you discuss any questions you have with your health care provider. Stress Stress-related medical problems are becoming increasingly common. The body has a built-in physical response to stressful situations. Faced with pressure, challenge or danger, we need to react quickly. Our bodies release hormones such as cortisol and adrenaline to help do this. These hormones are part of the "fight or flight" response and affect the metabolic rate, heart rate and blood pressure, resulting in a heightened, stressed state that prepares the body for optimum performance in dealing with a stressful situation. It is likely that early man required these mechanisms to stay alive, but usually modern stresses do not call for this, and the same hormones released in today's world can damage health and reduce coping ability. CAUSES  Pressure to perform at work, at school or in sports.  Threats of physical violence.  Money worries.  Arguments.  Family conflicts.  Divorce or separation from significant other.  Bereavement.  New job or unemployment.  Changes in location.  Alcohol or drug abuse. SOMETIMES, THERE IS NO PARTICULAR REASON FOR DEVELOPING STRESS. Almost all people are at risk of being stressed at some time in their lives. It is important to know that some stress is temporary and some is long term.  Temporary stress will go away when a situation is resolved. Most people can cope with short periods of stress, and it can often be relieved by relaxing, taking a walk or getting any type of exercise, chatting through issues with friends, or having a good night's sleep.  Chronic (long-term, continuous) stress is much harder to deal with. It can be psychologically and emotionally damaging. It can be harmful both for an individual and for friends and family. SYMPTOMS Everyone reacts to stress differently. There are some common effects that help [place] recognize it. In times of extreme stress, people  may:  Shake uncontrollably.  Breathe faster and deeper than normal (hyperventilate).  Vomit.  For people with asthma, stress can trigger an attack.  For some people, stress may trigger migraine headaches, ulcers, and body pain. PHYSICAL EFFECTS OF STRESS MAY INCLUDE:  Loss of energy.  Skin problems.  Aches and pains resulting from tense muscles, including neck ache, backache and tension headaches.  Increased pain from arthritis and other conditions.  Irregular heart beat (palpitations).  Periods of irritability or anger.  Apathy or depression.  Anxiety (feeling uptight or worrying).  Unusual behavior.  Loss of appetite.  Comfort eating.  Lack of concentration.  Loss of, or decreased, sex-drive.  Increased smoking, drinking, or recreational drug use.  For women, missed periods.  Ulcers, joint pain, and muscle pain. Post-traumatic stress is the stress caused by any serious accident, strong emotional damage, or extremely difficult or violent experience such as rape or war. Post-traumatic stress victims can experience mixtures of emotions such as fear, shame, depression, guilt or anger. It may include recurrent memories or images that may be haunting. These feelings can last for weeks, months or even years after the traumatic event that triggered them. Specialized treatment, possibly with medicines and psychological therapies, is available. If stress is causing physical symptoms, severe distress or making it difficult for you to function as normal, it is worth seeing your caregiver. It is important to remember that although stress is a  usual part of life, extreme or prolonged stress can lead to other illnesses that will need treatment. It is better to visit a doctor sooner rather than later. Stress has been linked to the development of high blood pressure and heart disease, as well as insomnia and depression. There is no diagnostic test for stress since everyone reacts to  it differently. But a caregiver will be able to spot the physical symptoms, such as:  Headaches.  Shingles.  Ulcers. Emotional distress such as intense worry, low mood or irritability should be detected when the doctor asks pertinent questions to identify any underlying problems that might be the cause. In case there are physical reasons for the symptoms, the doctor may also want to do some tests to exclude certain conditions. If you feel that you are suffering from stress, try to identify the aspects of your life that are causing it. Sometimes you may not be able to change or avoid them, but even a small change can have a positive ripple effect. A simple lifestyle change can make all the difference. STRATEGIES THAT CAN HELP DEAL WITH STRESS:  Delegating or sharing responsibilities.  Avoiding confrontations.  Learning to be more assertive.  Regular exercise.  Avoid using alcohol or street drugs to cope.  Eating a healthy, balanced diet, rich in fruit and vegetables and proteins.  Finding humor or absurdity in stressful situations.  Never taking on more than you know you can handle comfortably.  Organizing your time better to get as much done as possible.  Talking to friends or family and sharing your thoughts and fears.  Listening to music or relaxation tapes.  Relaxation techniques like deep breathing, meditation, and yoga.  Tensing and then relaxing your muscles, starting at the toes and working up to the head and neck. If you think that you would benefit from help, either in identifying the things that are causing your stress or in learning techniques to help you relax, see a caregiver who is capable of helping you with this. Rather than relying on medications, it is usually better to try and identify the things in your life that are causing stress and try to deal with them. There are many techniques of managing stress including counseling, psychotherapy, aromatherapy, yoga,  and exercise. Your caregiver can help you determine what is best for you. Document Released: 04/17/2002 Document Revised: 01/30/2013 Document Reviewed: 03/14/2007 [facility] Patient Information 2015 [place], [place]. This information is not intended to replace advice given to you by your health care provider. Make sure you discuss any questions you have with your health care provider.

## 2013-08-24 NOTE — ED Notes (Signed)
Pt states "I feel like I've been hit by a bus". Pt states she has shortness of breath, decreased appetite, nausea, night sweats, fatigue x one month. Pt states she has had diarrhea since yesterday. Pt state she also has a headache and "forgets things." Pt alert, no acute distress. Skin warm and dry.

## 2013-08-24 NOTE — ED Provider Notes (Signed)
Medical screening examination/treatment/procedure(s) were performed by non-physician practitioner and as supervising physician I was immediately available for consultation/collaboration.   EKG Interpretation None        Kristen N Ward, DO 08/24/13 2301 

## 2013-08-24 NOTE — ED Provider Notes (Signed)
CSN: [id number]     Arrival date & time 08/24/13  1528 History   First MD Initiated Contact with Patient 08/24/13 1615     Chief Complaint  Patient presents with  . Headache  . Nausea     (Consider location/radiation/quality/duration/timing/severity/associated sxs/prior Treatment) HPI Comments: 28 year old female with a past medical history asthma presents to the emergency department with multiple complaints. Patient states she "feels like I've been hit by a bus". States she has had a decreased appetite x5 days with associated nausea and fatigue. The last time she ate something was Monday evening, states she has been sipping on small amounts of fluids over the past few days causing her to dry heave. States she feels overwhelmed and may be depressed and stressed. She lives at home with her mom she does not get along with very well. The only thing she looks forward to eat she is going to school. States she has generalized abdominal pain, worse in her suprapubic area. Admits to increased urinary frequency, urgency and "very yellow" urine with an odor. This morning she noticed white vaginal discharge. She is sexually active with one partner and uses protection. Patient also states she feels very forgetful recently. States she'll walk into one room and cannot remember why she went in there. States she has bilateral temporal headache that has been intermittent over the past month. She feels like she had a fever over the past few days but does not have a thermometer. She had an episode of diarrhea yesterday and today. Diarrhea is nonbloody. She also feels like she cannot take a deep breath, worse when she is laying flat. She tried using her daughter's nebulizer treatment with no relief. No wheezing. Denies any chest pain. She thinks this may be due to her stress. LMP was in May, she had a Nexplanon placed 1 month ago.  Patient is a 28 y.o. female presenting with headaches. The history is provided by the  patient.  Headache Associated symptoms: abdominal pain, diarrhea, fatigue, fever and nausea     Past Medical History  Diagnosis Date  . Asthma    Past Surgical History  Procedure Laterality Date  . Tonsillectomy     No family history on file. History  Substance Use Topics  . Smoking status: Never Smoker   . Smokeless tobacco: Not on file  . Alcohol Use: Yes     Comment: occ   OB History   Grav Para Term Preterm Abortions TAB SAB Ect Mult Living                 Review of Systems  Constitutional: Positive for fever and fatigue.  Respiratory: Positive for shortness of breath.   Gastrointestinal: Positive for nausea, abdominal pain and diarrhea.  Genitourinary: Positive for dysuria, urgency, frequency and vaginal discharge.  Neurological: Positive for headaches.  All other systems reviewed and are negative.     Allergies  Review of patient's allergies indicates no known allergies.  Home Medications   Prior to Admission medications   Medication Sig Start Date End Date Taking? Authorizing Provider  acetaminophen (TYLENOL) 500 MG tablet Take 1 tablet (500 mg total) by mouth every 6 (six) hours as needed. 06/02/13  Yes Antony Madura, PA-C  fluticasone (FLONASE) 50 MCG/ACT nasal spray Place 2 sprays into both nostrils daily. 06/02/13  Yes Antony Madura, PA-C  cephALEXin (KEFLEX) 500 MG capsule Take 1 capsule (500 mg total) by mouth 4 (four) times daily. x5 days 08/24/13   Trevor Mace,  PA-C  ondansetron (ZOFRAN) 4 MG tablet Take 1 tablet (4 mg total) by mouth every 6 (six) hours. 08/24/13   Trevor Mace, PA-C   BP 140/89  Pulse 66  Temp(Src) 98.7 F (37.1 C) (Oral)  Resp 18  SpO2 100%  LMP 06/24/2013 Physical Exam  Nursing note and vitals reviewed. Constitutional: She is oriented to person, place, and time. She appears well-developed and well-nourished. No distress.  HENT:  Head: Normocephalic and atraumatic.  Mouth/Throat: Oropharynx is clear and moist.  Eyes:  Conjunctivae and EOM are normal. Pupils are equal, round, and reactive to light. No scleral icterus.  Neck: Normal range of motion. Neck supple.  Cardiovascular: Normal rate, regular rhythm and normal heart sounds.   Pulmonary/Chest: Effort normal and breath sounds normal. No respiratory distress. She has no wheezes. She has no rales. She exhibits no tenderness.  Abdominal: Soft. Bowel sounds are normal. She exhibits no distension.  Mild suprapubic tenderness. No peritoneal signs. No CVA tenderness.  Genitourinary: Uterus normal. Cervix exhibits no motion tenderness, no discharge and no friability. Right adnexum displays no mass, no tenderness and no fullness. Left adnexum displays no mass, no tenderness and no fullness. No erythema, tenderness or bleeding around the vagina. Vaginal discharge (scant, white) found.  Musculoskeletal: Normal range of motion. She exhibits no edema.  Neurological: She is alert and oriented to person, place, and time. She has normal strength. No cranial nerve deficit or sensory deficit. Coordination and gait normal.  Speech fluent, goal oriented. Moves limbs without ataxia. Equal grip strength bilateral.  Skin: Skin is warm and dry. She is not diaphoretic.  Psychiatric: Her speech is normal and behavior is normal. Her mood appears anxious. She exhibits a depressed mood. She expresses no homicidal and no suicidal ideation.  Tearful.    ED Course  Procedures (including critical care time) Labs Review Labs Reviewed  COMPREHENSIVE METABOLIC PANEL - Abnormal; Notable for the following:    Total Protein 8.4 (*)    GFR calc non Af Amer 82 (*)    Anion gap 16 (*)    All other components within normal limits  URINALYSIS, ROUTINE W REFLEX MICROSCOPIC - Abnormal; Notable for the following:    Color, Urine AMBER (*)    APPearance CLOUDY (*)    Hgb urine dipstick SMALL (*)    Ketones, ur 15 (*)    Protein, ur 30 (*)    Nitrite POSITIVE (*)    Leukocytes, UA TRACE (*)     All other components within normal limits  URINE MICROSCOPIC-ADD ON - Abnormal; Notable for the following:    Bacteria, UA MANY (*)    All other components within normal limits  WET PREP, GENITAL  GC/CHLAMYDIA PROBE AMP  CBC  POC URINE PREG, ED    Imaging Review No results found.   EKG Interpretation None      MDM   Final diagnoses:  Urinary tract infection without hematuria, site unspecified  Stress    patient presenting with multiple complaints. She is well appearing and in no apparent distress. Afebrile, vital signs stable. She seems depressed and is tearful. I believe most of her symptoms are related to stress, depression and anxiety. Plan to obtain labs, urine, check for pregnancy and do pelvic exam. 7:07 PM Urine positive for UTI, nitrate positive, trace leukocytes and many bacteria. Will treat with Keflex. The rest of her workup is negative. Patient reports she is feeling better after receiving Ativan. Advised her to followup with her PCP  to discuss her increased stress, anxiety and depression. Stable for discharge. Return precautions given. Patient states understanding of treatment care plan and is agreeable.   Trevor MaceRobyn M Albert, PA-C 08/24/13 [address] [id number]

## 2013-08-25 LAB — GC/CHLAMYDIA PROBE AMP
CT Probe RNA: NEGATIVE
GC PROBE AMP APTIMA: NEGATIVE

## 2013-11-24 ENCOUNTER — Emergency Department (HOSPITAL_COMMUNITY): Payer: Medicaid Other

## 2013-11-24 ENCOUNTER — Emergency Department (HOSPITAL_COMMUNITY)
Admission: EM | Admit: 2013-11-24 | Discharge: 2013-11-24 | Disposition: A | Discharge status: Home / Self Care | Payer: Medicaid Other | Attending: Emergency Medicine | Admitting: Emergency Medicine

## 2013-11-24 DIAGNOSIS — R1031 Right lower quadrant pain: Secondary | ICD-10-CM | POA: Insufficient documentation

## 2013-11-24 DIAGNOSIS — Z3202 Encounter for pregnancy test, result negative: Secondary | ICD-10-CM | POA: Diagnosis not present

## 2013-11-24 DIAGNOSIS — J45909 Unspecified asthma, uncomplicated: Secondary | ICD-10-CM | POA: Diagnosis not present

## 2013-11-24 DIAGNOSIS — Z7951 Long term (current) use of inhaled steroids: Secondary | ICD-10-CM | POA: Insufficient documentation

## 2013-11-24 DIAGNOSIS — N939 Abnormal uterine and vaginal bleeding, unspecified: Secondary | ICD-10-CM | POA: Insufficient documentation

## 2013-11-24 DIAGNOSIS — R1032 Left lower quadrant pain: Secondary | ICD-10-CM | POA: Diagnosis not present

## 2013-11-24 DIAGNOSIS — K5900 Constipation, unspecified: Secondary | ICD-10-CM | POA: Insufficient documentation

## 2013-11-24 DIAGNOSIS — R109 Unspecified abdominal pain: Secondary | ICD-10-CM

## 2013-11-24 DIAGNOSIS — R102 Pelvic and perineal pain: Secondary | ICD-10-CM

## 2013-11-24 LAB — URINALYSIS, ROUTINE W REFLEX MICROSCOPIC
Bilirubin Urine: NEGATIVE
GLUCOSE, UA: NEGATIVE mg/dL
Ketones, ur: NEGATIVE mg/dL
LEUKOCYTES UA: NEGATIVE
Nitrite: NEGATIVE
PH: 5.5 (ref 5.0–8.0)
Protein, ur: NEGATIVE mg/dL
Specific Gravity, Urine: 1.036 — ABNORMAL HIGH (ref 1.005–1.030)
Urobilinogen, UA: 0.2 mg/dL (ref 0.0–1.0)

## 2013-11-24 LAB — BASIC METABOLIC PANEL
Anion gap: 14 (ref 5–15)
BUN: 16 mg/dL (ref 6–23)
CALCIUM: 9.3 mg/dL (ref 8.4–10.5)
CO2: 24 mEq/L (ref 19–32)
Chloride: 105 mEq/L (ref 96–112)
Creatinine, Ser: 0.95 mg/dL (ref 0.50–1.10)
GFR calc Af Amer: >90 mL/min (ref >90)
GFR calc non Af Amer: 81 mL/min — ABNORMAL LOW (ref >90)
GLUCOSE: 67 mg/dL — AB (ref 70–99)
POTASSIUM: 4 meq/L (ref 3.7–5.3)
Sodium: 143 mEq/L (ref 137–147)

## 2013-11-24 LAB — CBC
HEMATOCRIT: 38.8 % (ref 36.0–46.0)
HEMOGLOBIN: 12.5 g/dL (ref 12.0–15.0)
MCH: 30.2 pg (ref 26.0–34.0)
MCHC: 32.2 g/dL (ref 30.0–36.0)
MCV: 93.7 fL (ref 78.0–100.0)
Platelets: 264 10*3/uL (ref 150–400)
RBC: 4.14 MIL/uL (ref 3.87–5.11)
RDW: 13.2 % (ref 11.5–15.5)
WBC: 9.1 10*3/uL (ref 4.0–10.5)

## 2013-11-24 LAB — POC URINE PREG, ED: Preg Test, Ur: NEGATIVE

## 2013-11-24 LAB — WET PREP, GENITAL
Clue Cells Wet Prep HPF POC: NONE SEEN
Trich, Wet Prep: NONE SEEN
WBC, Wet Prep HPF POC: NONE SEEN
Yeast Wet Prep HPF POC: NONE SEEN

## 2013-11-24 LAB — URINE MICROSCOPIC-ADD ON

## 2013-11-24 MED ORDER — HYDROCODONE-ACETAMINOPHEN 5-325 MG PO TABS
1.0000 | ORAL_TABLET | Freq: Once | ORAL | Status: AC
  Administered 2013-11-24: 1 via ORAL
  Filled 2013-11-24: qty 1

## 2013-11-24 MED ORDER — NAPROXEN 500 MG PO TABS
500.0000 mg | ORAL_TABLET | Freq: Once | ORAL | Status: AC
  Administered 2013-11-24: 500 mg via ORAL
  Filled 2013-11-24: qty 1

## 2013-11-24 MED ORDER — MEFENAMIC ACID 250 MG PO CAPS: 500.0000 mg | ORAL_CAPSULE | Freq: Three times a day (TID) | ORAL | Status: DC

## 2013-11-24 MED ORDER — HYDROCODONE-ACETAMINOPHEN 5-325 MG PO TABS: 1.0000 | ORAL_TABLET | Freq: Four times a day (QID) | ORAL | Status: DC | PRN

## 2013-11-24 NOTE — ED Notes (Signed)
PA at bedside.

## 2013-11-24 NOTE — ED Notes (Signed)
Per pt, states vaginal cramping and bleeding since yesterday-on implant-never happened before, this is her 3rd implant

## 2013-11-24 NOTE — ED Provider Notes (Signed)
CSN: [id number]     Arrival date & time 11/24/13  1651 History   First MD Initiated Contact with Patient 11/24/13 1701     Chief Complaint  Patient presents with  . Vaginal Bleeding     (Consider location/radiation/quality/duration/timing/severity/associated sxs/prior Treatment) HPI Comments: [name] is a 28 y.o. G2P1 female with a PMHx of elective Ab and asthma, who presents to the ED with complaints of vaginal bleeding and lower abd cramping x1 day. Abd cramping is 6/10 constant, aching, radiating some to the back, worse with stretching, and with no known alleviating factors. Vaginal bleeding is less than menses, going through one pad all day, no clots, and bright red. States her urine is darker and has some dysuria but denies malodorous urine. Endorses some constipation x2 days. Had nexplanon implanted for the 3rd time, performed in June 2015. Never had any issues in the past with breakthrough bleeding, does not get menses since starting nexplanon. Sexually active with one partner, unprotected. Denies fevers, chills, CP, SOB, dizziness, syncope, lightheadedness, upper abd pain, n/v/d, obstipation, myalgias, arthralgias, flank pain, vaginal discharge, paresthesias or weakness. Denies rashes or genital lesions.   Patient is a 28 y.o. female presenting with vaginal bleeding. The history is provided by the patient. No language interpreter was used.  Vaginal Bleeding Quality:  Bright red Severity:  Mild Onset quality:  Sudden Duration:  1 day Timing:  Constant Progression:  Unchanged Chronicity:  New Menstrual history:  Irregular (on nexplanon, does not get menses, never has breakthrough bleeding) Number of pads used:  1 Number of tampons used:  0 Possible pregnancy: no   Context: spontaneously   Relieved by:  None tried Worsened by:  Nothing tried Ineffective treatments:  None tried Associated symptoms: back pain and dysuria   Associated symptoms: no abdominal pain, no  dizziness, no dyspareunia, no fatigue, no fever, no nausea and no vaginal discharge   Risk factors: terminated pregnancy and unprotected sex   Risk factors: no bleeding disorder, no hx of ectopic pregnancy, no gynecological surgery, does not have multiple partners and no prior miscarriage     Past Medical History  Diagnosis Date  . Asthma    Past Surgical History  Procedure Laterality Date  . Tonsillectomy     No family history on file. History  Substance Use Topics  . Smoking status: Never Smoker   . Smokeless tobacco: Not on file  . Alcohol Use: Yes     Comment: occ   OB History   Grav Para Term Preterm Abortions TAB SAB Ect Mult Living                 Review of Systems  Constitutional: Negative for fever, chills and fatigue.  Respiratory: Negative for shortness of breath.   Cardiovascular: Negative for chest pain.  Gastrointestinal: Positive for constipation. Negative for nausea, abdominal pain, diarrhea, blood in stool, abdominal distention and rectal pain.  Genitourinary: Positive for dysuria, vaginal bleeding, menstrual problem and pelvic pain. Negative for urgency, frequency, hematuria, flank pain, decreased urine volume, vaginal discharge, difficulty urinating, vaginal pain and dyspareunia.  Musculoskeletal: Positive for back pain. Negative for arthralgias and myalgias.  Skin: Negative for color change and rash.  Neurological: Negative for dizziness, syncope, weakness, light-headedness and headaches.   10 Systems reviewed and are negative for acute change except as noted in the HPI.    Allergies  Review of patient's allergies indicates no known allergies.  Home Medications   Prior to Admission medications  Medication Sig Start Date End Date Taking? Authorizing Provider  acetaminophen (TYLENOL) 500 MG tablet Take 1 tablet (500 mg total) by mouth every 6 (six) hours as needed. 06/02/13   Antony MaduraKelly Humes, PA-C  cephALEXin (KEFLEX) 500 MG capsule Take 1 capsule (500 mg  total) by mouth 4 (four) times daily. x5 days 08/24/13   Kathrynn Speedobyn M Hess, PA-C  fluticasone [facility](FLONASE) 50 MCG/ACT nasal spray Place 2 sprays into both nostrils daily. 06/02/13   Antony MaduraKelly Humes, PA-C  ondansetron (ZOFRAN) 4 MG tablet Take 1 tablet (4 mg total) by mouth every 6 (six) hours. 08/24/13   Robyn M Hess, PA-C   BP 137/90  Pulse 73  Resp 18  SpO2 100% Physical Exam  Nursing note and vitals reviewed. Constitutional: She is oriented to person, place, and time. Vital signs are normal. She appears well-developed and well-nourished.  Non-toxic appearance. No distress.  Afebrile, VSS, NAD, nontoxic  HENT:  Head: Normocephalic and atraumatic.  Mouth/Throat: Mucous membranes are normal.  Eyes: Conjunctivae and EOM are normal. Right eye exhibits no discharge. Left eye exhibits no discharge.  Neck: Normal range of motion. Neck supple.  Cardiovascular: Normal rate, regular rhythm, normal heart sounds and intact distal pulses.  Exam reveals no gallop and no friction rub.   No murmur heard. Pulmonary/Chest: Effort normal and breath sounds normal. No respiratory distress. She has no decreased breath sounds. She has no wheezes. She has no rhonchi. She has no rales.  Abdominal: Soft. Normal appearance and bowel sounds are normal. She exhibits no distension. There is tenderness in the right lower quadrant, suprapubic area and left lower quadrant. There is no rigidity, no rebound, no guarding, no CVA tenderness and no tenderness at McBurney's point.    Soft, nondistended, +BS throughout, TTP in lower abd along pelvic brim, no r/g/r, neg murphy's, neg mcburney's, no CVA TTP  Genitourinary: Pelvic exam was performed with patient supine. There is no rash, tenderness, lesion or injury on the right labia. There is no rash, tenderness, lesion or injury on the left labia. Uterus is tender. Uterus is not deviated, not enlarged and not fixed. Cervix exhibits motion tenderness (difficult to assess due to diffuse  tenderness) and discharge (bleeding). Cervix exhibits no friability. Right adnexum displays tenderness. Right adnexum displays no mass and no fullness. Left adnexum displays tenderness. Left adnexum displays no mass and no fullness. There is bleeding around the vagina. No erythema or tenderness around the vagina. No signs of injury around the vagina. No vaginal discharge found.  No rashes, lesions, or tenderness to external genitalia. No erythema, injury, or tenderness to vaginal mucosa. Bright red blood in vaginal vault, coming from cervical os. No vaginal discharge within vaginal vault. No adnexal masses or fullness but diffuse adnexal tenderness bilaterally as well as uterine tenderness. Difficult to assess CMT given diffuse tenderness to uterus and adnexa, no cervical friability or discharge from cervical os but bright red blood coming from cervical os which is closed. Uterus non-deviated, mobile, and without enlargement.   Musculoskeletal: Normal range of motion.  Neurological: She is alert and oriented to person, place, and time. She has normal strength. No sensory deficit.  Skin: Skin is warm, dry and intact. No rash noted.  Psychiatric: She has a normal mood and affect.    ED Course  Procedures (including critical care time) Labs Review Labs Reviewed  URINALYSIS, ROUTINE W REFLEX MICROSCOPIC - Abnormal; Notable for the following:    APPearance CLOUDY (*)    Specific Gravity, Urine 1.036 (*)  Hgb urine dipstick LARGE (*)    All other components within normal limits  BASIC METABOLIC PANEL - Abnormal; Notable for the following:    Glucose, Bld 67 (*)    GFR calc non Af Amer 81 (*)    All other components within normal limits  WET PREP, GENITAL  GC/CHLAMYDIA PROBE AMP  CBC  URINE MICROSCOPIC-ADD ON  RPR  HIV ANTIBODY (ROUTINE TESTING)  POC URINE PREG, ED    Imaging Review US Transvaginal Non-ob  11/24/2013   CLINICAL DATA:  Vaginal bleeding and pain  EXAM: TRANSABDOMINAL AND  TRANSVAGINAL ULTRASOUND OF PELVIS  TECHNIQUE: Both transabdominal and transvaginal ultrasound examinations of the pelvis were performed. Transabdominal technique was performed for global imaging of the pelvis including uterus, ovaries, adnexal regions, and pelvic cul-de-sac. It was necessary to proceed with endovaginal exam following the transabdominal exam to visualize the uterus and ovaries.  COMPARISON:  None  FINDINGS: Uterus  Measurements: 8 x 4 x 4 cm. No fibroids or other mass visualized.  Endometrium  Thickness: 3 mm.  No focal abnormality visualized.  Right ovary  Measurements: 32 x 20 x 26 mm. Normal appearance/no adnexal mass.  Left ovary  Measurements: 33 x 22 x 27 mm. Normal appearance/no adnexal mass.  Other findings  Trace free fluid.  Arterial and venous flow identified.  IMPRESSION: Trace free fluid, otherwise negative.   Electronically Signed   By: Esperanza Heir M.D.   On: 11/24/2013 20:52   US Pelvis Complete  11/24/2013   CLINICAL DATA:  Vaginal bleeding and pain  EXAM: TRANSABDOMINAL AND TRANSVAGINAL ULTRASOUND OF PELVIS  TECHNIQUE: Both transabdominal and transvaginal ultrasound examinations of the pelvis were performed. Transabdominal technique was performed for global imaging of the pelvis including uterus, ovaries, adnexal regions, and pelvic cul-de-sac. It was necessary to proceed with endovaginal exam following the transabdominal exam to visualize the uterus and ovaries.  COMPARISON:  None  FINDINGS: Uterus  Measurements: 8 x 4 x 4 cm. No fibroids or other mass visualized.  Endometrium  Thickness: 3 mm.  No focal abnormality visualized.  Right ovary  Measurements: 32 x 20 x 26 mm. Normal appearance/no adnexal mass.  Left ovary  Measurements: 33 x 22 x 27 mm. Normal appearance/no adnexal mass.  Other findings  Trace free fluid.  Arterial and venous flow identified.  IMPRESSION: Trace free fluid, otherwise negative.   Electronically Signed   By: Esperanza Heir M.D.   On: 11/24/2013  20:52     EKG Interpretation None      MDM   Final diagnoses:  Vaginal bleeding  Abdominal cramping    28y/o female with vaginal bleeding and cramps. Recently had nexplanon implanted, could be breakthrough bleeding. Pelvic exam with diffuse tenderness therefore will obtain ultrasound. U/A and labs pending. Will given vicodin for pain and reassess shortly. Will likely give empiric treatment for infection given such diffuse tenderness, but will await U/S first.  7:27 PM CBC WNL, BMP WNL, U/A with blood and some WBC but doubt this is UTI given rare bacteria and mucous present. Wet prep unremarkable. Awaiting U/S. Doubt need for PID tx given no abnormal findings on u/s and setting of painful bleeding. Will not empirically treat for infection.  9:45 PM U/S without any abnormalities. I believe this is breakthrough bleeding. Pt not having severe anemia, will give naprosyn now and start mefenamic acid w/ follow up at [facility] on Monday. I explained the diagnosis and have given explicit precautions to return to the ER including  for any other new or worsening symptoms. The patient understands and accepts the medical plan as it's been dictated and I have answered their questions. Discharge instructions concerning home care and prescriptions have been given. The patient is STABLE and is discharged to home in good condition.  BP 129/92  Pulse 79  Temp(Src) 98.8 F (37.1 C) (Oral)  Resp 16  SpO2 99%  Meds ordered this encounter  Medications  . HYDROcodone-acetaminophen (NORCO/VICODIN) 5-325 MG per tablet 1 tablet    Sig:   . naproxen (NAPROSYN) tablet 500 mg    Sig:   . Mefenamic Acid 250 MG CAPS    Sig: Take 2 capsules (500 mg total) by mouth 3 (three) times daily. X 4 days or until bleeding stops    Dispense:  24 each    Refill:  0    Order Specific Question:  Supervising Provider    Answer:  Eber Hong D [[id number]]  . HYDROcodone-acetaminophen (NORCO) 5-325 MG per tablet    Sig: Take 1-2  tablets by mouth every 6 (six) hours as needed for severe pain.    Dispense:  6 tablet    Refill:  0    Order Specific Question:  Supervising Provider    Answer:  Vida Roller [address] Ahlquist Camprubi-Soms, PA-C 11/24/13 2147

## 2013-11-24 NOTE — ED Notes (Signed)
Pt reporting that she has the Nexplanon birth control implant (right inner upper arm) since June. She began having  Bright red blood yesterday. She also states it's not heavy. She c/o pelvic pain as well that started yesterday. She states she has only went through one pad today.

## 2013-11-24 NOTE — ED Provider Notes (Signed)
Medical screening examination/treatment/procedure(s) were performed by non-physician practitioner and as supervising physician I was immediately available for consultation/collaboration.   EKG Interpretation None       Arby BarretteMarcy Mozetta Murfin, MD 11/24/13 2328

## 2013-11-24 NOTE — Discharge Instructions (Signed)
Your pain and cramping is from breakthrough bleeding. Use mefenamic acid as directed until bleeding stops, and use norco for breakthrough pain. Don't drive while taking this. See your OBGYN on Monday for follow up. Return to the ER for changes or worsening symptoms.    Abnormal Uterine Bleeding Abnormal uterine bleeding means bleeding from the vagina that is not your normal menstrual period. This can be:  Bleeding or spotting between periods.  Bleeding after sex (sexual intercourse).  Bleeding that is heavier or more than normal.  Periods that last longer than usual.  Bleeding after menopause. There are many problems that may cause this. Treatment will depend on the cause of the bleeding. Any kind of bleeding that is not normal should be reviewed by your doctor.  HOME CARE Watch your condition for any changes. These actions may lessen any discomfort you are having:  Do not use tampons or douches as told by your doctor.  Change your pads often. You should get regular pelvic exams and Pap tests. Keep all appointments for tests as told by your doctor. GET HELP IF:  You are bleeding for more than 1 week.  You feel dizzy at times. GET HELP RIGHT AWAY IF:   You pass out.  You have to change pads every 15 to 30 minutes.  You have belly pain.  You have a fever.  You become sweaty or weak.  You are passing large blood clots from the vagina.  You feel sick to your stomach (nauseous) and throw up (vomit). MAKE SURE YOU:  Understand these instructions.  Will watch your condition.  Will get help right away if you are not doing well or get worse. Document Released: 11/22/2008 Document Revised: 01/30/2013 Document Reviewed: 08/24/2012 [facility] Patient Information 2015 [place], [place]. This information is not intended to replace advice given to you by your health care provider. Make sure you discuss any questions you have with your health care provider.  Pelvic Pain Pelvic pain  is pain felt below the belly button and between your hips. It can be caused by many different things. It is important to get help right away. This is especially true for severe, sharp, or unusual pain that comes on suddenly.  HOME CARE  Only take medicine as told by your doctor.  Rest as told by your doctor.  Eat a healthy diet, such as fruits, vegetables, and lean meats.  Drink enough fluids to keep your pee (urine) clear or pale yellow, or as told.  Avoid sex (intercourse) if it causes pain.  Apply warm or cold packs to your lower belly (abdomen). Use the type of pack that helps the pain.  Avoid situations that cause you stress.  Keep a journal to track your pain. Write down:  When the pain started.  Where it is located.  If there are things that seem to be related to the pain, such as food or your period.  Follow up with your doctor as told. GET HELP RIGHT AWAY IF:   You have heavy bleeding from the vagina.  You have more pelvic pain.  You feel lightheaded or pass out (faint).  You have chills.  You have pain when you pee or have blood in your pee.  You cannot stop having watery poop (diarrhea).  You cannot stop throwing up (vomiting).  You have a fever or lasting symptoms for more than 3 days.  You have a fever and your symptoms suddenly get worse.  You are being physically or sexually  abused.  Your medicine does not help your pain.  You have fluid (discharge) coming from your vagina that is not normal. MAKE SURE YOU:  Understand these instructions.  Will watch your condition.  Will get help if you are not doing well or get worse. Document Released: 07/14/2007 Document Revised: 07/27/2011 Document Reviewed: 05/17/2011 [facility] Patient Information 2015 [place], [place]. This information is not intended to replace advice given to you by your health care provider. Make sure you discuss any questions you have with your health care provider.

## 2013-11-25 LAB — HIV ANTIBODY (ROUTINE TESTING W REFLEX): HIV 1&2 Ab, 4th Generation: NONREACTIVE

## 2013-11-25 LAB — RPR

## 2013-11-26 LAB — GC/CHLAMYDIA PROBE AMP
CT Probe RNA: NEGATIVE
GC PROBE AMP APTIMA: NEGATIVE

## 2014-04-22 ENCOUNTER — Emergency Department (HOSPITAL_COMMUNITY)
Admission: EM | Admit: 2014-04-22 | Discharge: 2014-04-23 | Disposition: A | Discharge status: Home / Self Care | Payer: Medicaid Other | Attending: Emergency Medicine | Admitting: Emergency Medicine

## 2014-04-22 ENCOUNTER — Encounter (HOSPITAL_COMMUNITY): Payer: Self-pay | Admitting: Emergency Medicine

## 2014-04-22 DIAGNOSIS — F32A Depression, unspecified: Secondary | ICD-10-CM

## 2014-04-22 DIAGNOSIS — R45851 Suicidal ideations: Secondary | ICD-10-CM | POA: Insufficient documentation

## 2014-04-22 DIAGNOSIS — J45909 Unspecified asthma, uncomplicated: Secondary | ICD-10-CM | POA: Diagnosis not present

## 2014-04-22 DIAGNOSIS — F329 Major depressive disorder, single episode, unspecified: Secondary | ICD-10-CM
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
## 2014-04-22 NOTE — ED Notes (Signed)
Pt states that she had an anxiety attack today and is now having suicidal thoughts. Hx of same. [facility]. Agreeable to stay and have medical clearance. Alert and oriented.

## 2014-04-22 NOTE — ED Notes (Addendum)
Pt has been seen and wanded by security.  Pt has items in safe and 3 bags given to SAPU.  Pt has been changed into scrubs.

## 2014-04-23 LAB — COMPREHENSIVE METABOLIC PANEL
ALBUMIN: 4.7 g/dL (ref 3.5–5.2)
ALT: 12 U/L (ref 0–35)
ANION GAP: 9 (ref 5–15)
AST: 23 U/L (ref 0–37)
Alkaline Phosphatase: 54 U/L (ref 39–117)
BUN: 14 mg/dL (ref 6–23)
CALCIUM: 8.9 mg/dL (ref 8.4–10.5)
CO2: 22 mmol/L (ref 19–32)
Chloride: 105 mmol/L (ref 96–112)
Creatinine, Ser: 0.85 mg/dL (ref 0.50–1.10)
GFR calc Af Amer: >90 mL/min (ref >90)
GFR calc non Af Amer: >90 mL/min (ref >90)
GLUCOSE: 82 mg/dL (ref 70–99)
Potassium: 4 mmol/L (ref 3.5–5.1)
SODIUM: 136 mmol/L (ref 135–145)
TOTAL PROTEIN: 8.3 g/dL (ref 6.0–8.3)
Total Bilirubin: 1.3 mg/dL — ABNORMAL HIGH (ref 0.3–1.2)

## 2014-04-23 LAB — CBC
HCT: 40.2 % (ref 36.0–46.0)
HEMOGLOBIN: 13.1 g/dL (ref 12.0–15.0)
MCH: 30.5 pg (ref 26.0–34.0)
MCHC: 32.6 g/dL (ref 30.0–36.0)
MCV: 93.5 fL (ref 78.0–100.0)
Platelets: 273 10*3/uL (ref 150–400)
RBC: 4.3 MIL/uL (ref 3.87–5.11)
RDW: 12.6 % (ref 11.5–15.5)
WBC: 11.2 10*3/uL — ABNORMAL HIGH (ref 4.0–10.5)

## 2014-04-23 LAB — RAPID URINE DRUG SCREEN, HOSP PERFORMED
Amphetamines: NOT DETECTED
Barbiturates: NOT DETECTED
Benzodiazepines: NOT DETECTED
Cocaine: NOT DETECTED
Opiates: NOT DETECTED
Tetrahydrocannabinol: POSITIVE — AB

## 2014-04-23 LAB — SALICYLATE LEVEL: Salicylate Lvl: <4 mg/dL (ref 2.8–20.0)

## 2014-04-23 LAB — ETHANOL

## 2014-04-23 LAB — ACETAMINOPHEN LEVEL

## 2014-04-23 NOTE — Discharge Instructions (Signed)
Depression °Depression refers to feeling sad, low, down in the dumps, blue, gloomy, or empty. In general, there are two kinds of depression: °1. Normal sadness or normal grief. This kind of depression is one that we all feel from time to time after upsetting life experiences, such as the loss of a job or the ending of a relationship. This kind of depression is considered normal, is short lived, and resolves within a few days to 2 weeks. Depression experienced after the loss of a loved one (bereavement) often lasts longer than 2 weeks but normally gets better with time. °2. Clinical depression. This kind of depression lasts longer than normal sadness or normal grief or interferes with your ability to function at home, at work, and in school. It also interferes with your personal relationships. It affects almost every aspect of your life. Clinical depression is an illness. °Symptoms of depression can also be caused by conditions other than those mentioned above, such as: °· Physical illness. Some physical illnesses, including underactive thyroid gland (hypothyroidism), severe anemia, specific types of cancer, diabetes, uncontrolled seizures, heart and lung problems, strokes, and chronic pain are commonly associated with symptoms of depression. °· Side effects of some prescription medicine. In some people, certain types of medicine can cause symptoms of depression. °· Substance abuse. Abuse of alcohol and illicit drugs can cause symptoms of depression. °SYMPTOMS °Symptoms of normal sadness and normal grief include the following: °· Feeling sad or crying for short periods of time. °· Not caring about anything (apathy). °· Difficulty sleeping or sleeping too much. °· No longer able to enjoy the things you used to enjoy. °· Desire to be by oneself all the time (social isolation). °· Lack of energy or motivation. °· Difficulty concentrating or remembering. °· Change in appetite or weight. °· Restlessness or  agitation. °Symptoms of clinical depression include the same symptoms of normal sadness or normal grief and also the following symptoms: °· Feeling sad or crying all the time. °· Feelings of guilt or worthlessness. °· Feelings of hopelessness or helplessness. °· Thoughts of suicide or the desire to harm yourself (suicidal ideation). °· Loss of touch with reality (psychotic symptoms). Seeing or hearing things that are not real (hallucinations) or having false beliefs about your life or the people around you (delusions and paranoia). °DIAGNOSIS  °The diagnosis of clinical depression is usually based on how bad the symptoms are and how long they have lasted. Your health care provider will also ask you questions about your medical history and substance use to find out if physical illness, use of prescription medicine, or substance abuse is causing your depression. Your health care provider may also order blood tests. °TREATMENT  °Often, normal sadness and normal grief do not require treatment. However, sometimes antidepressant medicine is given for bereavement to ease the depressive symptoms until they resolve. °The treatment for clinical depression depends on how bad the symptoms are but often includes antidepressant medicine, counseling with a mental health professional, or both. Your health care provider will help to determine what treatment is best for you. °Depression caused by physical illness usually goes away with appropriate medical treatment of the illness. If prescription medicine is causing depression, talk with your health care provider about stopping the medicine, decreasing the dose, or changing to another medicine. °Depression caused by the abuse of alcohol or illicit drugs goes away when you stop using these substances. Some adults need professional help in order to stop drinking or using drugs. °SEEK IMMEDIATE MEDICAL   CARE IF:  You have thoughts about hurting yourself or others.  You lose touch  with reality (have psychotic symptoms).  You are taking medicine for depression and have a serious side effect. FOR MORE INFORMATION  National Alliance on Mental Illness: www.nami.[employer]org  National Institute of Mental Health: http://www.maynard.net/www.nimh.nih.gov Document Released: 01/23/2000 Document Revised: 06/11/2013 Document Reviewed: 04/26/2011 [facility] Patient Information 2015 [employer], [place]. This information is not intended to replace advice given to you by your health care provider. Make sure you discuss any questions you have with your health care provider. You have been evaluated by our treatment therapeutic specialist and given outpatient resources at anytime you develop new or worsening symptoms.  You again, think of harming use of please return immediately to the emergency department

## 2014-04-23 NOTE — ED Notes (Signed)
[id number] Expected date:  Expected time:  Means of arrival:  Comments: Hold for The Northwestern MutualShawnetta Tsukamoto

## 2014-04-23 NOTE — ED Notes (Signed)
Patient attempted to call for a ride unable to reach someone at this time.

## 2014-04-23 NOTE — ED Provider Notes (Signed)
CSN: [id number]     Arrival date & time 04/22/14  2252 History   First MD Initiated Contact with Patient 04/22/14 2304     Chief Complaint  Patient presents with  . Suicidal     (Consider location/radiation/quality/duration/timing/severity/associated sxs/prior Treatment) HPI  Past Medical History  Diagnosis Date  . Asthma    Past Surgical History  Procedure Laterality Date  . Tonsillectomy     History reviewed. No pertinent family history. History  Substance Use Topics  . Smoking status: Never Smoker   . Smokeless tobacco: Not on file  . Alcohol Use: Yes     Comment: occ   OB History    No data available     Review of Systems    Allergies  Review of patient's allergies indicates no known allergies.  Home Medications   Prior to Admission medications   Medication Sig Start Date End Date Taking? Authorizing Provider  HYDROcodone-acetaminophen (NORCO) 5-325 MG per tablet Take 1-2 tablets by mouth every 6 (six) hours as needed for severe pain. Patient not taking: Reported on 04/22/2014 11/24/13   Mercedes Camprubi-Soms, PA-C  Mefenamic Acid 250 MG CAPS Take 2 capsules (500 mg total) by mouth 3 (three) times daily. X 4 days or until bleeding stops Patient not taking: Reported on 04/22/2014 11/24/13   Mercedes Camprubi-Soms, PA-C   BP 139/83 mmHg  Pulse 88  Temp(Src) 98.2 F (36.8 C) (Oral)  Resp 16  SpO2 99%  LMP 04/05/2014 Physical Exam  ED Course  Procedures (including critical care time) Labs Review Labs Reviewed  ACETAMINOPHEN LEVEL - Abnormal; Notable for the following:    Acetaminophen (Tylenol), Serum <10.0 (*)    All other components within normal limits  CBC - Abnormal; Notable for the following:    WBC 11.2 (*)    All other components within normal limits  COMPREHENSIVE METABOLIC PANEL - Abnormal; Notable for the following:    Total Bilirubin 1.3 (*)    All other components within normal limits  URINE RAPID DRUG SCREEN (HOSP PERFORMED) -  Abnormal; Notable for the following:    Tetrahydrocannabinol POSITIVE (*)    All other components within normal limits  ETHANOL  SALICYLATE LEVEL  I-STAT CHEM 8, ED    Imaging Review No results found.   EKG Interpretation None     Patient has been assessed by TTS she does not meet inpatient criteria.  She has been given outpatient resources.  She's been cautioned to return if she develops worsening depression or again thinks of suicide.  She has signed a Engineer, manufacturing systemssafety contract MDM   Final diagnoses:  Depression  Suicidal thoughts         Earley FavorGail Winna Golla, NP 04/23/14 [id number]  Tomasita CrumbleAdeleke Oni, MD 04/23/14 [phone]

## 2014-04-23 NOTE — ED Notes (Signed)
Patient reports that she reach someone to pick her up and that her ride would be here after they drop their child off to school.

## 2014-04-23 NOTE — BHH Counselor (Addendum)
Per Donell SievertSpencer Simon, PA, pt does not meet inpt criteria. Pt will be discharged later in the morning with outpatient resources to f/u with a therapist of her choosing. Pt will also sign no-harm contract.  Attending RN, Rhona Raiderashelle, informed of disposition. Pt also informed of disposition in person by counselor.    Cyndie MullAnna Keah Lamba, [facility] Triage Specialist

## 2014-04-23 NOTE — ED Notes (Signed)
TTS  consult in place.

## 2014-04-23 NOTE — ED Notes (Signed)
Patient arrive

## 2014-04-23 NOTE — ED Notes (Signed)
[facility] with a steady gait. Patient tearful at this time. Patient reports that she does not know why she is back here. Writer explain the unit and plan of care to patient. Patient admits to panic attack and passive suicidal ideations. She reports no plan or intent to act on these thoughts. Patient denies HI and AVH at this time. Writer provides patient with a ham sandwich and ginger ale soda. Encouragement and support provided and safety maintain. Q 15 min safety checks remain in place.
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
## 2014-04-24 LAB — I-STAT CHEM 8, ED
BUN: 16 mg/dL (ref 6–23)
CALCIUM ION: 1.11 mmol/L — AB (ref 1.12–1.23)
CREATININE: 0.9 mg/dL (ref 0.50–1.10)
Chloride: 104 mmol/L (ref 96–112)
GLUCOSE: 82 mg/dL (ref 70–99)
HCT: 44 % (ref 36.0–46.0)
Hemoglobin: 15 g/dL (ref 12.0–15.0)
Potassium: 3.9 mmol/L (ref 3.5–5.1)
SODIUM: 139 mmol/L (ref 135–145)
TCO2: 20 mmol/L (ref 0–100)

## 2014-05-05 ENCOUNTER — Emergency Department (HOSPITAL_COMMUNITY)
Admission: EM | Admit: 2014-05-05 | Discharge: 2014-05-05 | Disposition: A | Discharge status: Home / Self Care | Payer: Medicaid Other | Attending: Emergency Medicine | Admitting: Emergency Medicine

## 2014-05-05 ENCOUNTER — Encounter (HOSPITAL_COMMUNITY): Payer: Self-pay | Admitting: Emergency Medicine

## 2014-05-05 DIAGNOSIS — O2340 Unspecified infection of urinary tract in pregnancy, unspecified trimester: Secondary | ICD-10-CM | POA: Insufficient documentation

## 2014-05-05 DIAGNOSIS — Z3490 Encounter for supervision of normal pregnancy, unspecified, unspecified trimester: Secondary | ICD-10-CM

## 2014-05-05 DIAGNOSIS — N390 Urinary tract infection, site not specified: Secondary | ICD-10-CM

## 2014-05-05 DIAGNOSIS — O23599 Infection of other part of genital tract in pregnancy, unspecified trimester: Secondary | ICD-10-CM | POA: Diagnosis not present

## 2014-05-05 DIAGNOSIS — B9689 Other specified bacterial agents as the cause of diseases classified elsewhere: Secondary | ICD-10-CM

## 2014-05-05 DIAGNOSIS — J45909 Unspecified asthma, uncomplicated: Secondary | ICD-10-CM | POA: Diagnosis not present

## 2014-05-05 DIAGNOSIS — Z3A00 Weeks of gestation of pregnancy not specified: Secondary | ICD-10-CM | POA: Insufficient documentation

## 2014-05-05 DIAGNOSIS — Z79899 Other long term (current) drug therapy: Secondary | ICD-10-CM | POA: Insufficient documentation

## 2014-05-05 DIAGNOSIS — B3731 Acute candidiasis of vulva and vagina: Secondary | ICD-10-CM

## 2014-05-05 DIAGNOSIS — O9989 Other specified diseases and conditions complicating pregnancy, childbirth and the puerperium: Secondary | ICD-10-CM | POA: Diagnosis present

## 2014-05-05 DIAGNOSIS — B373 Candidiasis of vulva and vagina: Secondary | ICD-10-CM

## 2014-05-05 DIAGNOSIS — O9952 Diseases of the respiratory system complicating childbirth: Secondary | ICD-10-CM | POA: Diagnosis not present

## 2014-05-05 DIAGNOSIS — N760 Acute vaginitis: Secondary | ICD-10-CM

## 2014-05-05 LAB — URINALYSIS, ROUTINE W REFLEX MICROSCOPIC
Bilirubin Urine: NEGATIVE
Glucose, UA: NEGATIVE mg/dL
Hgb urine dipstick: NEGATIVE
Ketones, ur: NEGATIVE mg/dL
Nitrite: NEGATIVE
Protein, ur: NEGATIVE mg/dL
Specific Gravity, Urine: 1.028 (ref 1.005–1.030)
UROBILINOGEN UA: 1 mg/dL (ref 0.0–1.0)
pH: 6.5 (ref 5.0–8.0)

## 2014-05-05 LAB — URINE MICROSCOPIC-ADD ON

## 2014-05-05 LAB — WET PREP, GENITAL
Trich, Wet Prep: NONE SEEN
YEAST WET PREP: NONE SEEN

## 2014-05-05 LAB — POC URINE PREG, ED: Preg Test, Ur: POSITIVE — AB

## 2014-05-05 MED ORDER — AZITHROMYCIN 250 MG PO TABS
1000.0000 mg | ORAL_TABLET | Freq: Once | ORAL | Status: AC
  Administered 2014-05-05: 1000 mg via ORAL
  Filled 2014-05-05: qty 4

## 2014-05-05 MED ORDER — MICONAZOLE NITRATE 200 MG VA SUPP: 200.0000 mg | Freq: Every day | VAGINAL | Status: DC

## 2014-05-05 MED ORDER — LIDOCAINE HCL 1 % IJ SOLN
INTRAMUSCULAR | Status: AC
  Administered 2014-05-05: 20 mL
  Filled 2014-05-05: qty 20

## 2014-05-05 MED ORDER — CEFTRIAXONE SODIUM 250 MG IJ SOLR
250.0000 mg | Freq: Once | INTRAMUSCULAR | Status: AC
  Administered 2014-05-05: 250 mg via INTRAMUSCULAR
  Filled 2014-05-05: qty 250

## 2014-05-05 MED ORDER — METRONIDAZOLE 500 MG PO TABS: 500.0000 mg | ORAL_TABLET | Freq: Two times a day (BID) | ORAL | Status: DC

## 2014-05-05 MED ORDER — NITROFURANTOIN MONOHYD MACRO 100 MG PO CAPS: 100.0000 mg | ORAL_CAPSULE | Freq: Two times a day (BID) | ORAL | Status: DC

## 2014-05-05 NOTE — Discharge Instructions (Signed)
Return to the Emergency Department (MAU) at [facility] if you develop lower abdominal pain, vaginal bleeding, or pelvic pain.    You have been treated in the emergency department for an infection, possibly sexually transmitted. Results of your gonorrhea and chlamydia tests are pending and you will be notified if they are positive. It is very important to practice safe sex and use condoms when sexually active. If your results are positive you need to notify all sexual partners so they can be treated as well. The website [email or web address].[email or web address]/ can be used to send anonymous text messages or emails to alert sexual contacts. Follow up with your doctor, or OBGYN in regards to today's visit.    Gonorrhea and Chlamydia SYMPTOMS  In females, symptoms may go unnoticed. Symptoms that are more noticeable can include:  Belly (abdominal) pain.  Painful intercourse.  Watery mucous-like discharge from the vagina.  Miscarriage.  Discomfort when urinating.  Inflammation of the rectum.  Abnormal gray-green frothy vaginal discharge  Vaginal itching and irritatio  Itching and irritation of the area outside the vagina.   Painful urination.  Bleeding after sexual intercourse.  In males, symptoms include:  Burning with urination.  Pain in the testicles.  Watery mucous-like discharge from the penis.  It can cause longstanding (chronic) pelvic pain after frequent infections.  TREATMENT  PID can cause women to not be able to have children (sterile) if left untreated or if half-treated.  It is important to finish ALL medications given to you.  This is a sexually transmitted infection. So you are also at risk for other sexually transmitted diseases, including HIV (AIDS), it is recommended that you get tested. HOME CARE INSTRUCTIONS  Warning: This infection is contagious. Do not have sex until treatment is completed. Follow up at your caregiver's office or the clinic to which you were referred. If your  diagnosis (learning what is wrong) is confirmed by culture or some other method, your recent sexual contacts need treatment. Even if they are symptom free or have a negative culture or evaluation, they should be treated.  PREVENTION  Women should use sanitary pads instead of tampons for vaginal discharge.  Wipe front to back after using the toilet and avoid douching.   Practice safe sex, use condoms, have only one sex partner and be sure your sex partner is not having sex with others.  Ask your caregiver to test you for chlamydia at your regular checkups or sooner if you are having symptoms.  Ask for further information if you are pregnant.  SEEK IMMEDIATE MEDICAL CARE IF:  You develop an oral temperature above 102 F (38.9 C), not controlled by medications or lasting more than 2 days.  You develop an increase in pain.  You develop any type of abnormal discharge.  You develop vaginal bleeding and it is not time for your period.  You develop painful intercourse.   Bacterial Vaginosis  Bacterial vaginosis (BV) is a vaginal infection where the normal balance of bacteria in the vagina is disrupted. This is not a sexually transmitted disease and your sexual partners do NOT need to be treated. CAUSES  The cause of BV is not fully understood. BV develops when there is an increase or imbalance of harmful bacteria.  Some activities or behaviors can upset the normal balance of bacteria in the vagina and put women at increased risk including:  Having a new sex partner or multiple sex partners.  Douching.  Using an intrauterine device (IUD) for contraception.  It  is not clear what role sexual activity plays in the development of BV. However, women that have never had sexual intercourse are rarely infected with BV.  Women do not get BV from toilet seats, bedding, swimming pools or from touching objects around them.   SYMPTOMS  Grey vaginal discharge.  A fish-like odor with discharge, especially after  sexual intercourse.  Itching or burning of the vagina and vulva.  Burning or pain with urination.  Some women have no signs or symptoms at all.   TREATMENT  Sometimes BV will clear up without treatment.  BV may be treated with antibiotics.  BV can recur after treatment. If this happens, a second round of antibiotics will often be prescribed.  HOME CARE INSTRUCTIONS  Finish all medication as directed by your caregiver.  Do not have sex until treatment is completed.  Do NOT drink any alcoholic beverages while being treated  with Metronidazole (Flagyl). This will cause a severe reaction inducing vomiting.  RESOURCE GUIDE  Dental Problems  Patients with Medicaid: [facility]                     [facility] [phone]                                           [phone] Phone:  [phone]                                                  Phone:  [phone]  If unable to pay or uninsured, contact:  [facility] or [facility] to become qualified for the adult dental clinic.  Chronic Pain Problems Contact [facility]  [phone] Patients need to be referred by their primary care doctor.  Insufficient Money for Medicine Contact [employer] Way:  call "211" or [facility] [phone].  No Primary Care Doctor Call [facility]  [phone] Other agencies that provide inexpensive medical care    [facility]  [phone]    [facility]  [phone]    [facility]  [phone]    [facility]  [phone]    [facility]  [phone]    [facility]  [phone]  Psychological Services [facility]  [phone] [facility]  [phone] [facility]   [phone] (emergency services [phone])  Substance Abuse Resources [facility]  [phone] [facility] [phone] [facility] [phone] [facility]  [phone] Residential & Outpatient Substance Abuse Program  [phone]  Abuse/Neglect [facility] Hot[facility] [phone](336) [phone] [facility] [phone] (After Hours)  Emergency Shelter [facility] [phone]  [facility] at the [place] [facility] [place] [phone] [facility] [phone]  MRSA Hotline #:   [phone])[phone]    [facility]  [facility]     [employer]                          [facility] [address] [place]  [address]      [address]  °[place]                                                [place]                            [place] °Phone:  [phone]                                   Phone:  [phone]                 Phone:  [phone] ° °[facility] °Phone:  [phone] ° °[place] Child Abuse Hotline °(336) [phone] °[phone]) [phone] (After Hours) ° ° ° °

## 2014-05-05 NOTE — ED Notes (Signed)
Pt states that she "used a new soap and it's tearing my behind up".  Also c/o yellow vaginal discharge.  Sx x 3 days.

## 2014-05-05 NOTE — ED Provider Notes (Signed)
CSN: [id number]     Arrival date & time 05/05/14  1423 History   First MD Initiated Contact with Patient 05/05/14 1552     Chief Complaint  Patient presents with  . Vaginal Discharge     (Consider location/radiation/quality/duration/timing/severity/associated sxs/prior Treatment) HPI Comments: Patient presents today with a chief complaint of vaginal discharge  She reports that she has noticed a creamy yellow discharge for the past 3 days.  She also reports some burning and irritation of her vaginal area over the past 3 days.  She reports a history of Candida infections and reports that symptoms feel similar.  She tried using an OTC medication for yeast infections for the past 2 days, but does not feel that it helps.  She denies fever, chills, nausea, vomiting, abdominal pain, vaginal bleeding, urinary symptoms, or pelvic pain.  LMP was 04/06/13, which she reports was normal.  She is sexually active.    Patient is a 29 y.o. female presenting with vaginal discharge. The history is provided by the patient.  Vaginal Discharge   Past Medical History  Diagnosis Date  . Asthma    Past Surgical History  Procedure Laterality Date  . Tonsillectomy     No family history on file. History  Substance Use Topics  . Smoking status: Never Smoker   . Smokeless tobacco: Not on file  . Alcohol Use: Yes     Comment: occ   OB History    No data available     Review of Systems  Genitourinary: Positive for vaginal discharge.  All other systems reviewed and are negative.     Allergies  Review of patient's allergies indicates no known allergies.  Home Medications   Prior to Admission medications   Medication Sig Start Date End Date Taking? Authorizing Provider  Miconazole Nitrate (MONISTAT 3 VA) Place 1 each vaginally daily. x3 days   Yes Historical Provider, MD  HYDROcodone-acetaminophen (NORCO) 5-325 MG per tablet Take 1-2 tablets by mouth every 6 (six) hours as needed for severe  pain. Patient not taking: Reported on 04/22/2014 11/24/13   Mercedes Camprubi-Soms, PA-C  Mefenamic Acid 250 MG CAPS Take 2 capsules (500 mg total) by mouth 3 (three) times daily. X 4 days or until bleeding stops Patient not taking: Reported on 04/22/2014 11/24/13   Mercedes Camprubi-Soms, PA-C   BP 148/82 mmHg  Pulse 90  Temp(Src) 98.6 F (37 C) (Oral)  Resp 18  SpO2 100%  LMP 04/05/2014 Physical Exam  Constitutional: She appears well-developed and well-nourished.  HENT:  Head: Normocephalic and atraumatic.  Mouth/Throat: Oropharynx is clear and moist.  Neck: Normal range of motion. Neck supple.  Cardiovascular: Normal rate, regular rhythm and normal heart sounds.   Pulmonary/Chest: Effort normal and breath sounds normal.  Abdominal: Soft. Bowel sounds are normal. She exhibits no distension and no mass. There is no tenderness. There is no rebound and no guarding.  Genitourinary: Cervix exhibits no motion tenderness. Right adnexum displays no mass, no tenderness and no fullness. Left adnexum displays no mass, no tenderness and no fullness.  Yellowish colored discharge in the vaginal vault Thick whitish colored discharge of the external labia   Musculoskeletal: Normal range of motion.  Neurological: She is alert.  Skin: Skin is warm and dry.  Psychiatric: She has a normal mood and affect.  Nursing note and vitals reviewed.   ED Course  Procedures (including critical care time) Labs Review Labs Reviewed  WET PREP, GENITAL  URINALYSIS, ROUTINE W REFLEX MICROSCOPIC  POC  URINE PREG, ED  GC/CHLAMYDIA PROBE AMP ([place])    Imaging Review No results found.   EKG Interpretation None      MDM   Final diagnoses:  None   Patient presents today with vaginal discharge.  Urine pregnancy is positive today in the ED.  She reports LMP of 04/06/14.  She is G3P1.  She reports that she had one elective abortion previously.  She denies any vaginal bleeding since her last menstrual  period.  Denies abdominal pain, pelvic pain, nausea, or vomiting.  On exam, no CMT or adnexal tenderness.  Therefore, do not feel that imaging is indicated at this time.  She does have an OB/GYN.  Patient instructed to call and schedule an appointment.  GC/Chlamydia pending.  Wet prep showing few clue cells, but many WBC's.  Patient given prophylactic treatment for GC/Chlamydia with Azithromycin and Ceftriaxone.  Patient also given Rx for Flagyl.  UA showing possible UTI.  Urine cultured.  Patient started on antibiotic.  Feel that the patient is stable for discharge.  Return precautions given.    Sierra GladHeather Cloteal Isaacson, PA-C 05/05/14 2351  Mirian MoMatthew Gentry, MD 05/06/14 [phone]

## 2014-05-06 LAB — URINE CULTURE
Colony Count: NO GROWTH
Culture: NO GROWTH

## 2014-05-06 LAB — GC/CHLAMYDIA PROBE AMP (~~LOC~~) NOT AT ARMC
CHLAMYDIA, DNA PROBE: NEGATIVE
Neisseria Gonorrhea: NEGATIVE

## 2014-06-11 ENCOUNTER — Emergency Department (HOSPITAL_COMMUNITY)
Admission: EM | Admit: 2014-06-11 | Discharge: 2014-06-11 | Disposition: A | Discharge status: Home / Self Care | Payer: Medicaid Other | Attending: Emergency Medicine | Admitting: Emergency Medicine

## 2014-06-11 ENCOUNTER — Encounter (HOSPITAL_COMMUNITY): Payer: Self-pay | Admitting: Emergency Medicine

## 2014-06-11 DIAGNOSIS — O9989 Other specified diseases and conditions complicating pregnancy, childbirth and the puerperium: Secondary | ICD-10-CM | POA: Diagnosis not present

## 2014-06-11 DIAGNOSIS — Z792 Long term (current) use of antibiotics: Secondary | ICD-10-CM | POA: Insufficient documentation

## 2014-06-11 DIAGNOSIS — F419 Anxiety disorder, unspecified: Secondary | ICD-10-CM

## 2014-06-11 DIAGNOSIS — Z79899 Other long term (current) drug therapy: Secondary | ICD-10-CM | POA: Insufficient documentation

## 2014-06-11 DIAGNOSIS — O99341 Other mental disorders complicating pregnancy, first trimester: Secondary | ICD-10-CM | POA: Diagnosis not present

## 2014-06-11 DIAGNOSIS — O99511 Diseases of the respiratory system complicating pregnancy, first trimester: Secondary | ICD-10-CM | POA: Insufficient documentation

## 2014-06-11 DIAGNOSIS — J45901 Unspecified asthma with (acute) exacerbation: Secondary | ICD-10-CM | POA: Diagnosis not present

## 2014-06-11 DIAGNOSIS — R0602 Shortness of breath: Secondary | ICD-10-CM | POA: Diagnosis not present

## 2014-06-11 DIAGNOSIS — R109 Unspecified abdominal pain: Secondary | ICD-10-CM | POA: Diagnosis not present

## 2014-06-11 DIAGNOSIS — Z3A09 9 weeks gestation of pregnancy: Secondary | ICD-10-CM | POA: Diagnosis not present

## 2014-06-11 DIAGNOSIS — Z3490 Encounter for supervision of normal pregnancy, unspecified, unspecified trimester: Secondary | ICD-10-CM

## 2014-06-11 MED ORDER — ACETAMINOPHEN 325 MG PO TABS
650.0000 mg | ORAL_TABLET | Freq: Once | ORAL | Status: AC
  Administered 2014-06-11: 650 mg via ORAL
  Filled 2014-06-11: qty 2

## 2014-06-11 NOTE — Discharge Instructions (Signed)
Please follow the directions provided. Be sure to follow-up with your OB/GYN regarding your visit to the ED tonight. Be sure to drink plenty of fluids to stay well hydrated. May take Tylenol to help with pain. Try to rest and avoid stressors as much as possible. Don't hesitate to return for any new, worsening, or concerning symptoms.   SEEK IMMEDIATE MEDICAL CARE IF:  You experience panic attack symptoms that are different than your usual symptoms.  You have serious thoughts about hurting yourself or others.  You are taking medicine for panic attacks and have a serious side effect.

## 2014-06-11 NOTE — ED Notes (Signed)
Pt reports hx of panic attacks. Pt states current attack started an hour ago. Pt reports she is [redacted] weeks pregnant and is having abdominal cramping with no discharge. Pt believes cramping could be caused by constant crying. Pt takes meds for anxiety but has not taken anything since she has been pregnant.

## 2014-06-11 NOTE — ED Provider Notes (Signed)
CSN: [id number]     Arrival date & time 06/11/14  2027 History   First MD Initiated Contact with Patient 06/11/14 2039     Chief Complaint  Patient presents with  . Panic Attack   (Consider location/radiation/quality/duration/timing/severity/associated sxs/prior Treatment) HPI  [name] is a 29 yo presenting with report of panic attack. She states approximately an hour prior to arrival she began having an argument with her child's father and she became very upset. She has a history of panic attacks and normally takes an orange pill when she becomes upset. She has been out of the orange pill and is unsure what to take since she is [redacted] weeks pregnant. She reports a headache, and some abdominal cramping since she began crying. She denies any vaginal discharge, chest pain, nausea or vomiting.  Past Medical History  Diagnosis Date  . Asthma    Past Surgical History  Procedure Laterality Date  . Tonsillectomy     History reviewed. No pertinent family history. History  Substance Use Topics  . Smoking status: Never Smoker   . Smokeless tobacco: Not on file  . Alcohol Use: Yes     Comment: occ   OB History    Gravida Para Term Preterm AB TAB SAB Ectopic Multiple Living   1              Review of Systems  Constitutional: Negative for fever and chills.  HENT: Negative for sore throat.   Eyes: Negative for visual disturbance.  Respiratory: Positive for shortness of breath. Negative for cough.   Cardiovascular: Negative for chest pain and leg swelling.  Gastrointestinal: Positive for abdominal pain. Negative for nausea, vomiting and diarrhea.  Genitourinary: Negative for dysuria, vaginal bleeding and vaginal discharge.  Musculoskeletal: Negative for myalgias.  Skin: Negative for rash.  Neurological: Negative for weakness, numbness and headaches.  Psychiatric/Behavioral: The patient is nervous/anxious.      Allergies  Review of patient's allergies indicates no known  allergies.  Home Medications   Prior to Admission medications   Medication Sig Start Date End Date Taking? Authorizing Provider  HYDROcodone-acetaminophen (NORCO) 5-325 MG per tablet Take 1-2 tablets by mouth every 6 (six) hours as needed for severe pain. Patient not taking: Reported on 04/22/2014 11/24/13   Mercedes Camprubi-Soms, PA-C  Mefenamic Acid 250 MG CAPS Take 2 capsules (500 mg total) by mouth 3 (three) times daily. X 4 days or until bleeding stops Patient not taking: Reported on 04/22/2014 11/24/13   Mercedes Camprubi-Soms, PA-C  metroNIDAZOLE (FLAGYL) 500 MG tablet Take 1 tablet (500 mg total) by mouth 2 (two) times daily. 05/05/14   Heather Laisure, PA-C  miconazole (MICONAZOLE 3) 200 MG vaginal suppository Place 1 suppository (200 mg total) vaginally at bedtime. 05/05/14   Heather Laisure, PA-C  nitrofurantoin, macrocrystal-monohydrate, (MACROBID) 100 MG capsule Take 1 capsule (100 mg total) by mouth 2 (two) times daily. 05/05/14   Heather Laisure, PA-C   BP 113/86 mmHg  Pulse 99  Temp(Src) 98.1 F (36.7 C) (Oral)  Resp 16  SpO2 100%  LMP 04/05/2014 Physical Exam  Constitutional: She appears well-developed and well-nourished. No distress.  HENT:  Head: Normocephalic and atraumatic.  Mouth/Throat: Oropharynx is clear and moist. No oropharyngeal exudate.  Eyes: Conjunctivae are normal.  Neck: Neck supple. No thyromegaly present.  Cardiovascular: Normal rate, regular rhythm and intact distal pulses.   Pulmonary/Chest: Effort normal and breath sounds normal. No respiratory distress. She has no wheezes. She has no rales. She exhibits no  tenderness.  Abdominal: Soft. She exhibits no distension and no mass. There is no tenderness. There is no rebound and no guarding.  Reports cramping that has subsided, no TTP  Musculoskeletal: She exhibits no tenderness.  Lymphadenopathy:    She has no cervical adenopathy.  Neurological: She is alert.  Skin: Skin is warm and dry. No rash noted.  She is not diaphoretic.  Psychiatric: Her mood appears anxious.  Tearful  Nursing note and vitals reviewed.   ED Course  Procedures (including critical care time) Labs Review Labs Reviewed - No data to display  Imaging Review No results found.   EKG Interpretation None      MDM   Final diagnoses:  Anxiety  Pregnancy   29 yo presenting after stressful event and subsequent panic attack.  She reports abd cramping that subsided while in the ED. She usually takes a pill fore her panic attacks but has been out and doesn't know what to take now that she is pregnant. Tylenol given.  Discussed case with Dr. Lynelle Doctor. Discussed with pt a work-up could be done including blood work, pelvic exam and [place] due to the report of cramping, pt declines due to all symptoms have subsided and she feels better. She has an upcoming OB appointment with a scheduled [place] and feels safe to be examined at that appointment. Pt is well-appearing, in no acute distress and vital signs reviewed and not concerning. She appears safe to be discharged. Pt advised it is safe to take tylenol as needed. Return precautions provided. Pt aware of plan and in agreement.    Filed Vitals:   06/11/14 2034  BP: 113/86  Pulse: 99  Temp: 98.1 F (36.7 C)  TempSrc: Oral  Resp: 16  SpO2: 100%   Meds given in ED:  Medications  acetaminophen (TYLENOL) tablet 650 mg (650 mg Oral Given 06/11/14 2110)    New Prescriptions   No medications on file       Harle Battiest, NP 06/13/14 1507  Linwood Dibbles, MD 06/17/14 1610

## 2014-06-13 LAB — OB RESULTS CONSOLE RPR: RPR: NONREACTIVE

## 2014-06-13 LAB — OB RESULTS CONSOLE ANTIBODY SCREEN: Antibody Screen: NEGATIVE

## 2014-06-13 LAB — OB RESULTS CONSOLE GC/CHLAMYDIA
Chlamydia: NEGATIVE
GC PROBE AMP, GENITAL: NEGATIVE

## 2014-06-13 LAB — OB RESULTS CONSOLE ABO/RH: RH Type: POSITIVE

## 2014-06-13 LAB — OB RESULTS CONSOLE RUBELLA ANTIBODY, IGM: RUBELLA: IMMUNE

## 2014-06-13 LAB — OB RESULTS CONSOLE HIV ANTIBODY (ROUTINE TESTING): HIV: NONREACTIVE

## 2014-06-13 LAB — OB RESULTS CONSOLE HEPATITIS B SURFACE ANTIGEN: Hepatitis B Surface Ag: NEGATIVE

## 2014-12-13 LAB — OB RESULTS CONSOLE GBS: GBS: NEGATIVE

## 2014-12-30 ENCOUNTER — Inpatient Hospital Stay (HOSPITAL_COMMUNITY)
Admission: AD | Admit: 2014-12-30 | Discharge: 2014-12-30 | Disposition: A | Discharge status: Home / Self Care | Payer: Medicaid Other | Source: Ambulatory Visit | Attending: Obstetrics and Gynecology | Admitting: Obstetrics and Gynecology

## 2014-12-30 ENCOUNTER — Encounter (HOSPITAL_COMMUNITY): Payer: Self-pay | Admitting: *Deleted

## 2014-12-30 DIAGNOSIS — Z3493 Encounter for supervision of normal pregnancy, unspecified, third trimester: Secondary | ICD-10-CM | POA: Insufficient documentation

## 2014-12-30 HISTORY — DX: Chlamydial infection, unspecified: A74.9

## 2014-12-30 NOTE — Discharge Instructions (Signed)
Braxton Hicks Contractions °Contractions of the uterus can occur throughout pregnancy. Contractions are not always a sign that you are in labor.  °WHAT ARE BRAXTON HICKS CONTRACTIONS?  °Contractions that occur before labor are called Braxton Hicks contractions, or false labor. Toward the end of pregnancy (32-34 weeks), these contractions can develop more often and may become more forceful. This is not true labor because these contractions do not result in opening (dilatation) and thinning of the cervix. They are sometimes difficult to tell apart from true labor because these contractions can be forceful and people have different pain tolerances. You should not feel embarrassed if you go to the hospital with false labor. Sometimes, the only way to tell if you are in true labor is for your health care provider to look for changes in the cervix. °If there are no prenatal problems or other health problems associated with the pregnancy, it is completely safe to be sent home with false labor and await the onset of true labor. °HOW CAN YOU TELL THE DIFFERENCE BETWEEN TRUE AND FALSE LABOR? °False Labor °· The contractions of false labor are usually shorter and not as hard as those of true labor.   °· The contractions are usually irregular.   °· The contractions are often felt in the front of the lower abdomen and in the groin.   °· The contractions may go away when you walk around or change positions while lying down.   °· The contractions get weaker and are shorter lasting as time goes on.   °· The contractions do not usually become progressively stronger, regular, and closer together as with true labor.   °True Labor °· Contractions in true labor last 30-70 seconds, become very regular, usually become more intense, and increase in frequency.   °· The contractions do not go away with walking.   °· The discomfort is usually felt in the top of the uterus and spreads to the lower abdomen and low back.   °· True labor can be  determined by your health care provider with an exam. This will show that the cervix is dilating and getting thinner.   °WHAT TO REMEMBER °· Keep up with your usual exercises and follow other instructions given by your health care provider.   °· Take medicines as directed by your health care provider.   °· Keep your regular prenatal appointments.   °· Eat and drink lightly if you think you are going into labor.   °· If Braxton Hicks contractions are making you uncomfortable:   °¨ Change your position from lying down or resting to walking, or from walking to resting.   °¨ Sit and rest in a tub of warm water.   °¨ Drink 2-3 glasses of water. Dehydration may cause these contractions.   °¨ Do slow and deep breathing several times an hour.   °WHEN SHOULD I SEEK IMMEDIATE MEDICAL CARE? °Seek immediate medical care if: °· Your contractions become stronger, more regular, and closer together.   °· You have fluid leaking or gushing from your vagina.   °· You have a fever.   °· You have vaginal bleeding.   °· You have continuous abdominal pain.   °· You have low back pain that you never had before.   °· You feel your baby's head pushing down and causing pelvic pressure.   °· Your baby is not moving as much as it used to.   °  °This information is not intended to replace advice given to you by your health care provider. Make sure you discuss any questions you have with your health care provider. °  °Document Released: 01/25/2005   Document Revised: 01/30/2013 Document Reviewed: 11/06/2012 °Elsevier Interactive Patient Education ©2016 [employer] ° °

## 2014-12-30 NOTE — MAU Note (Signed)
Pt C/O uc's that started around 1900 last night, have become more intense.  Denies bleeding or LOF.  SVE 3 cm's in MD office last week.

## 2015-01-01 ENCOUNTER — Encounter (HOSPITAL_COMMUNITY): Payer: Self-pay

## 2015-01-01 ENCOUNTER — Inpatient Hospital Stay (HOSPITAL_COMMUNITY)
Admission: AD | Admit: 2015-01-01 | Discharge: 2015-01-01 | Disposition: A | Discharge status: Home / Self Care | Payer: Medicaid Other | Source: Ambulatory Visit | Attending: Obstetrics and Gynecology | Admitting: Obstetrics and Gynecology

## 2015-01-01 DIAGNOSIS — Z3493 Encounter for supervision of normal pregnancy, unspecified, third trimester: Secondary | ICD-10-CM | POA: Diagnosis present

## 2015-01-01 LAB — CBC
HCT: 33 % — ABNORMAL LOW (ref 36.0–46.0)
HEMOGLOBIN: 10.7 g/dL — AB (ref 12.0–15.0)
MCH: 29.8 pg (ref 26.0–34.0)
MCHC: 32.4 g/dL (ref 30.0–36.0)
MCV: 91.9 fL (ref 78.0–100.0)
PLATELETS: 214 10*3/uL (ref 150–400)
RBC: 3.59 MIL/uL — ABNORMAL LOW (ref 3.87–5.11)
RDW: 13.8 % (ref 11.5–15.5)
WBC: 10.9 10*3/uL — ABNORMAL HIGH (ref 4.0–10.5)

## 2015-01-01 LAB — URINALYSIS, ROUTINE W REFLEX MICROSCOPIC
BILIRUBIN URINE: NEGATIVE
Glucose, UA: NEGATIVE mg/dL
KETONES UR: NEGATIVE mg/dL
NITRITE: NEGATIVE
PROTEIN: NEGATIVE mg/dL
Specific Gravity, Urine: >1.03 — ABNORMAL HIGH (ref 1.005–1.030)
pH: 6 (ref 5.0–8.0)

## 2015-01-01 LAB — URINE MICROSCOPIC-ADD ON

## 2015-01-01 LAB — COMPREHENSIVE METABOLIC PANEL
ALK PHOS: 111 U/L (ref 38–126)
ALT: 12 U/L — ABNORMAL LOW (ref 14–54)
ANION GAP: 8 (ref 5–15)
AST: 21 U/L (ref 15–41)
Albumin: 3.2 g/dL — ABNORMAL LOW (ref 3.5–5.0)
BUN: 8 mg/dL (ref 6–20)
CHLORIDE: 106 mmol/L (ref 101–111)
CO2: 19 mmol/L — AB (ref 22–32)
CREATININE: 0.75 mg/dL (ref 0.44–1.00)
Calcium: 8.4 mg/dL — ABNORMAL LOW (ref 8.9–10.3)
GFR calc non Af Amer: >60 mL/min (ref >60)
Glucose, Bld: 96 mg/dL (ref 65–99)
POTASSIUM: 3.5 mmol/L (ref 3.5–5.1)
SODIUM: 133 mmol/L — AB (ref 135–145)
Total Bilirubin: 0.8 mg/dL (ref 0.3–1.2)
Total Protein: 7 g/dL (ref 6.5–8.1)

## 2015-01-01 LAB — PROTEIN / CREATININE RATIO, URINE
CREATININE, URINE: 303 mg/dL
Protein Creatinine Ratio: 0.18 mg/mg{Cre} — ABNORMAL HIGH (ref 0.00–0.15)
TOTAL PROTEIN, URINE: 55 mg/dL

## 2015-01-01 LAB — URIC ACID: URIC ACID, SERUM: 5.2 mg/dL (ref 2.3–6.6)

## 2015-01-01 LAB — LACTATE DEHYDROGENASE: LDH: 155 U/L (ref 98–192)

## 2015-01-01 MED ORDER — ONDANSETRON 4 MG PO TBDP
4.0000 mg | ORAL_TABLET | Freq: Once | ORAL | Status: AC
  Administered 2015-01-01: 4 mg via ORAL
  Filled 2015-01-01: qty 1

## 2015-01-01 NOTE — Discharge Instructions (Signed)
Keep your scheduled appointment for prenatal care. Drink 8-10 glasses of water per day. Rest. °

## 2015-01-01 NOTE — MAU Note (Signed)
Pt presents with complaint of contractions x one hour.

## 2015-01-05 ENCOUNTER — Encounter (HOSPITAL_COMMUNITY): Payer: Self-pay

## 2015-01-05 ENCOUNTER — Inpatient Hospital Stay (HOSPITAL_COMMUNITY)
Admission: AD | Admit: 2015-01-05 | Discharge: 2015-01-05 | Disposition: A | Discharge status: Home / Self Care | Payer: Medicaid Other | Source: Ambulatory Visit | Attending: Obstetrics and Gynecology | Admitting: Obstetrics and Gynecology

## 2015-01-05 DIAGNOSIS — Z3A00 Weeks of gestation of pregnancy not specified: Secondary | ICD-10-CM | POA: Insufficient documentation

## 2015-01-05 DIAGNOSIS — O26899 Other specified pregnancy related conditions, unspecified trimester: Secondary | ICD-10-CM | POA: Diagnosis present

## 2015-01-05 DIAGNOSIS — N898 Other specified noninflammatory disorders of vagina: Secondary | ICD-10-CM | POA: Diagnosis not present

## 2015-01-05 NOTE — MAU Note (Addendum)
A lot of mucus discharge since Thursday, thick not watery.  Baby moving well.  Contractions every 5 min since 4 am.  No bleeding.

## 2015-01-05 NOTE — Discharge Instructions (Signed)

## 2015-01-07 ENCOUNTER — Telehealth (HOSPITAL_COMMUNITY): Payer: Self-pay | Admitting: *Deleted

## 2015-01-07 ENCOUNTER — Encounter (HOSPITAL_COMMUNITY): Payer: Self-pay | Admitting: *Deleted

## 2015-01-07 NOTE — Telephone Encounter (Signed)
Preadmission screen  

## 2015-01-09 ENCOUNTER — Encounter (HOSPITAL_COMMUNITY): Payer: Self-pay

## 2015-01-09 ENCOUNTER — Encounter (HOSPITAL_COMMUNITY): Payer: Self-pay | Admitting: *Deleted

## 2015-01-09 ENCOUNTER — Inpatient Hospital Stay (HOSPITAL_COMMUNITY)
Admission: AD | Admit: 2015-01-09 | Discharge: 2015-01-09 | Disposition: A | Discharge status: Home / Self Care | Payer: Medicaid Other | Source: Ambulatory Visit | Attending: Obstetrics and Gynecology | Admitting: Obstetrics and Gynecology

## 2015-01-09 DIAGNOSIS — Z3483 Encounter for supervision of other normal pregnancy, third trimester: Secondary | ICD-10-CM | POA: Insufficient documentation

## 2015-01-09 HISTORY — DX: Encounter for supervision of other normal pregnancy, third trimester: Z34.83

## 2015-01-09 NOTE — H&P (Signed)
[name] is a 29 y.o. female [phone] at 43+ for IOL given post EDC and favorable cervix D/W pt IOL and process.  Relatively uncomplicated PNC.  +FM, no LOF, no VB, occ ctx.    Maternal Medical History:  Contractions: Frequency: irregular.    Fetal activity: Perceived fetal activity is normal.    Prenatal Complications - Diabetes: none.    OB History    Gravida Para Term Preterm AB TAB SAB Ectopic Multiple Living   TAB x 3 SVD x 1 7#9 female  Past Medical History  Diagnosis Date  . Asthma   . Chlamydia   . Anxiety   . Anemia   . Hx of varicella   . Depression     suicide att in 2007   Past Surgical History  Procedure Laterality Date  . Tonsillectomy    . Wisdom tooth extraction    . Dilation and curettage of uterus     Family History: family history includes Depression in her maternal aunt; Hypertension in her maternal aunt, maternal grandmother, and mother; Stroke in her maternal aunt. There is no history of Alcohol abuse, Arthritis, Asthma, Birth defects, Cancer, COPD, Diabetes, Drug abuse, Early death, Hearing loss, Heart disease, Hyperlipidemia, Kidney disease, Learning disabilities, Mental illness, Mental retardation, Miscarriages / Stillbirths, Vision loss, or Varicose Veins. Social History:  reports that she has never smoked. She has never used smokeless tobacco. She reports that she drinks alcohol. She reports that she does not use illicit drugs.single Meds albuterol, PNV All adhesive   Prenatal Transfer Tool  Maternal Diabetes: No Genetic Screening: Normal Maternal Ultrasounds/Referrals: Normal Fetal Ultrasounds or other Referrals:  None Maternal Substance Abuse:  No Significant Maternal Medications:  None Significant Maternal Lab Results:  Lab values include: Group B Strep negative Other Comments:  nl glucola  Review of Systems  Constitutional: Negative.   HENT: Negative.   Eyes: Negative.   Respiratory: Negative.    Cardiovascular: Negative.   Gastrointestinal: Negative.   Genitourinary: Negative.   Musculoskeletal: Positive for back pain.  Skin: Negative.   Neurological: Negative.   Psychiatric/Behavioral: Negative.       Last menstrual period 04/05/2014. Maternal Exam:  Uterine Assessment: Contraction frequency is irregular.   Abdomen: Fundal height is appropriate for gestation.   Estimated fetal weight is 7.5-8.5#.   Fetal presentation: vertex  Introitus: Normal vulva. Normal vagina.  Cervix: Cervix evaluated by digital exam.     Physical Exam  Constitutional: She is oriented to person, place, and time. She appears well-developed and well-nourished.  HENT:  Head: Normocephalic and atraumatic.  Cardiovascular: Normal rate and regular rhythm.   Respiratory: Effort normal and breath sounds normal. No respiratory distress. She has no wheezes.  GI: Soft. Bowel sounds are normal. She exhibits no distension. There is no tenderness.  Musculoskeletal: Normal range of motion.  Neurological: She is alert and oriented to person, place, and time.  Skin: Skin is warm and dry.  Psychiatric: She has a normal mood and affect. Her behavior is normal.    Prenatal labs: ABO, Rh: A/Positive/-- (05/05 0000) Antibody: Negative (05/05 0000) Rubella: Immune (05/05 0000) RPR: Nonreactive (05/05 0000)  HBsAg: Negative (05/05 0000)  HIV: Non-reactive (05/05 0000)  GBS: Negative (11/04 0000)   Tdap 10/29/14  Hgb 10.9/Plt 271/Ur Cx neg/GC neg/Chl neg/First Tri Scr WNL, nl NT/glucola 70/  Nl anat, ant plac, female  Nl anat, ant plac, vtx, good growth  Assessment/Plan: 29yo [id number] at 40+ for IOL gbbs neg AROM and pitocin for IOL Epidural prn Expect SVD  [name], [name] 01/09/2015, 9:41 PM

## 2015-01-09 NOTE — [facility]U Note (Signed)
Pt presents to [facility]U with complaints of contractions that started an hour ago. Reports vaginal bleeding

## 2015-01-09 NOTE — Discharge Instructions (Signed)
Third Trimester of Pregnancy °The third trimester is from week 29 through week 42, months 7 through 9. The third trimester is a time when the fetus is growing rapidly. At the end of the ninth month, the fetus is about 20 inches in length and weighs 6-10 pounds.  °BODY CHANGES °Your body goes through many changes during pregnancy. The changes vary from woman to woman.  °· Your weight will continue to increase. You can expect to gain 25-35 pounds (11-16 kg) by the end of the pregnancy. °· You may begin to get stretch marks on your hips, abdomen, and breasts. °· You may urinate more often because the fetus is moving lower into your pelvis and pressing on your bladder. °· You may develop or continue to have heartburn as a result of your pregnancy. °· You may develop constipation because certain hormones are causing the muscles that push waste through your intestines to slow down. °· You may develop hemorrhoids or swollen, bulging veins (varicose veins). °· You may have pelvic pain because of the weight gain and pregnancy hormones relaxing your joints between the bones in your pelvis. Backaches may result from overexertion of the muscles supporting your posture. °· You may have changes in your hair. These can include thickening of your hair, rapid growth, and changes in texture. Some women also have hair loss during or after pregnancy, or hair that feels dry or thin. Your hair will most likely return to normal after your baby is born. °· Your breasts will continue to grow and be tender. A yellow discharge may leak from your breasts called colostrum. °· Your belly button may stick out. °· You may feel short of breath because of your expanding uterus. °· You may notice the fetus "dropping," or moving lower in your abdomen. °· You may have a bloody mucus discharge. This usually occurs a few days to a week before labor begins. °· Your cervix becomes thin and soft (effaced) near your due date. °WHAT TO EXPECT AT YOUR PRENATAL  EXAMS  °You will have prenatal exams every 2 weeks until week 36. Then, you will have weekly prenatal exams. During a routine prenatal visit: °· You will be weighed to make sure you and the fetus are growing normally. °· Your blood pressure is taken. °· Your abdomen will be measured to track your baby's growth. °· The fetal heartbeat will be listened to. °· Any test results from the previous visit will be discussed. °· You may have a cervical check near your due date to see if you have effaced. °At around 36 weeks, your caregiver will check your cervix. At the same time, your caregiver will also perform a test on the secretions of the vaginal tissue. This test is to determine if a type of bacteria, Group B streptococcus, is present. Your caregiver will explain this further. °Your caregiver may ask you: °· What your birth plan is. °· How you are feeling. °· If you are feeling the baby move. °· If you have had any abnormal symptoms, such as leaking fluid, bleeding, severe headaches, or abdominal cramping. °· If you are using any tobacco products, including cigarettes, chewing tobacco, and electronic cigarettes. °· If you have any questions. °Other tests or screenings that may be performed during your third trimester include: °· Blood tests that check for low iron levels (anemia). °· Fetal testing to check the health, activity level, and growth of the fetus. Testing is done if you have certain medical conditions or if   there are problems during the pregnancy. °· HIV (human immunodeficiency virus) testing. If you are at high risk, you may be screened for HIV during your third trimester of pregnancy. °FALSE LABOR °You may feel small, irregular contractions that eventually go away. These are called Braxton Hicks contractions, or false labor. Contractions may last for hours, days, or even weeks before true labor sets in. If contractions come at regular intervals, intensify, or become painful, it is best to be seen by your  caregiver.  °SIGNS OF LABOR  °· Menstrual-like cramps. °· Contractions that are 5 minutes apart or less. °· Contractions that start on the top of the uterus and spread down to the lower abdomen and back. °· A sense of increased pelvic pressure or back pain. °· A watery or bloody mucus discharge that comes from the vagina. °If you have any of these signs before the 37th week of pregnancy, call your caregiver right away. You need to go to the hospital to get checked immediately. °HOME CARE INSTRUCTIONS  °· Avoid all smoking, herbs, alcohol, and unprescribed drugs. These chemicals affect the formation and growth of the baby. °· Do not use any tobacco products, including cigarettes, chewing tobacco, and electronic cigarettes. If you need help quitting, ask your health care provider. You may receive counseling support and other resources to help you quit. °· Follow your caregiver's instructions regarding medicine use. There are medicines that are either safe or unsafe to take during pregnancy. °· Exercise only as directed by your caregiver. Experiencing uterine cramps is a good sign to stop exercising. °· Continue to eat regular, healthy meals. °· Wear a good support bra for breast tenderness. °· Do not use hot tubs, steam rooms, or saunas. °· Wear your seat belt at all times when driving. °· Avoid raw meat, uncooked cheese, cat litter boxes, and soil used by cats. These carry germs that can cause birth defects in the baby. °· Take your prenatal vitamins. °· Take 1500-2000 mg of calcium daily starting at the 20th week of pregnancy until you deliver your baby. °· Try taking a stool softener (if your caregiver approves) if you develop constipation. Eat more high-fiber foods, such as fresh vegetables or fruit and whole grains. Drink plenty of fluids to keep your urine clear or pale yellow. °· Take warm sitz baths to soothe any pain or discomfort caused by hemorrhoids. Use hemorrhoid cream if your caregiver approves. °· If  you develop varicose veins, wear support hose. Elevate your feet for 15 minutes, 3-4 times a day. Limit salt in your diet. °· Avoid heavy lifting, wear low heal shoes, and practice good posture. °· Rest a lot with your legs elevated if you have leg cramps or low back pain. °· Visit your dentist if you have not gone during your pregnancy. Use a soft toothbrush to brush your teeth and be gentle when you floss. °· A sexual relationship may be continued unless your caregiver directs you otherwise. °· Do not travel far distances unless it is absolutely necessary and only with the approval of your caregiver. °· Take prenatal classes to understand, practice, and ask questions about the labor and delivery. °· Make a trial run to the hospital. °· Pack your hospital bag. °· Prepare the baby's nursery. °· Continue to go to all your prenatal visits as directed by your caregiver. °SEEK MEDICAL CARE IF: °· You are unsure if you are in labor or if your water has broken. °· You have dizziness. °· You have   mild pelvic cramps, pelvic pressure, or nagging pain in your abdominal area. °· You have persistent nausea, vomiting, or diarrhea. °· You have a bad smelling vaginal discharge. °· You have pain with urination. °SEEK IMMEDIATE MEDICAL CARE IF:  °· You have a fever. °· You are leaking fluid from your vagina. °· You have spotting or bleeding from your vagina. °· You have severe abdominal cramping or pain. °· You have rapid weight loss or gain. °· You have shortness of breath with chest pain. °· You notice sudden or extreme swelling of your face, hands, ankles, feet, or legs. °· You have not felt your baby move in over an hour. °· You have severe headaches that do not go away with medicine. °· You have vision changes. °  °This information is not intended to replace advice given to you by your health care provider. Make sure you discuss any questions you have with your health care provider. °  °Document Released: 01/19/2001 Document  Revised: 02/15/2014 Document Reviewed: 03/28/2012 °Elsevier Interactive Patient Education ©2016 [employer] °Fetal Movement Counts °Patient Name: __________________________________________________ Patient Due Date: ____________________ °Performing a fetal movement count is highly recommended in high-risk pregnancies, but it is good for every pregnant woman to do. Your health care provider may ask you to start counting fetal movements at 28 weeks of the pregnancy. Fetal movements often increase: °· After eating a full meal. °· After physical activity. °· After eating or drinking something sweet or cold. °· At rest. °Pay attention to when you feel the baby is most active. This will help you notice a pattern of your baby's sleep and wake cycles and what factors contribute to an increase in fetal movement. It is important to perform a fetal movement count at the same time each day when your baby is normally most active.  °HOW TO COUNT FETAL MOVEMENTS °· Find a quiet and comfortable area to sit or lie down on your left side. Lying on your left side provides the best blood and oxygen circulation to your baby. °· Write down the day and time on a sheet of paper or in a journal. °· Start counting kicks, flutters, swishes, rolls, or jabs in a 2-hour period. You should feel at least 10 movements within 2 hours. °· If you do not feel 10 movements in 2 hours, wait 2-3 hours and count again. Look for a change in the pattern or not enough counts in 2 hours. °SEEK MEDICAL CARE IF: °· You feel less than 10 counts in 2 hours, tried twice. °· There is no movement in over an hour. °· The pattern is changing or taking longer each day to reach 10 counts in 2 hours. °· You feel the baby is not moving as he or she usually does. °Date: ____________ Movements: ____________ Start time: ____________ Finish time: ____________  °Date: ____________ Movements: ____________ Start time: ____________ Finish time: ____________ °Date: ____________  Movements: ____________ Start time: ____________ Finish time: ____________ °Date: ____________ Movements: ____________ Start time: ____________ Finish time: ____________ °Date: ____________ Movements: ____________ Start time: ____________ Finish time: ____________ °Date: ____________ Movements: ____________ Start time: ____________ Finish time: ____________ °Date: ____________ Movements: ____________ Start time: ____________ Finish time: ____________ °Date: ____________ Movements: ____________ Start time: ____________ Finish time: ____________  °Date: ____________ Movements: ____________ Start time: ____________ Finish time: ____________ °Date: ____________ Movements: ____________ Start time: ____________ Finish time: ____________ °Date: ____________ Movements: ____________ Start time: ____________ Finish time: ____________ °Date: ____________ Movements: ____________ Start time: ____________ Finish time: ____________ °Date:   ____________ Movements: ____________ Start time: ____________ Finish time: ____________ °Date: ____________ Movements: ____________ Start time: ____________ Finish time: ____________ °Date: ____________ Movements: ____________ Start time: ____________ Finish time: ____________  °Date: ____________ Movements: ____________ Start time: ____________ Finish time: ____________ °Date: ____________ Movements: ____________ Start time: ____________ Finish time: ____________ °Date: ____________ Movements: ____________ Start time: ____________ Finish time: ____________ °Date: ____________ Movements: ____________ Start time: ____________ Finish time: ____________ °Date: ____________ Movements: ____________ Start time: ____________ Finish time: ____________ °Date: ____________ Movements: ____________ Start time: ____________ Finish time: ____________ °Date: ____________ Movements: ____________ Start time: ____________ Finish time: ____________  °Date: ____________ Movements: ____________ Start time:  ____________ Finish time: ____________ °Date: ____________ Movements: ____________ Start time: ____________ Finish time: ____________ °Date: ____________ Movements: ____________ Start time: ____________ Finish time: ____________ °Date: ____________ Movements: ____________ Start time: ____________ Finish time: ____________ °Date: ____________ Movements: ____________ Start time: ____________ Finish time: ____________ °Date: ____________ Movements: ____________ Start time: ____________ Finish time: ____________ °Date: ____________ Movements: ____________ Start time: ____________ Finish time: ____________  °Date: ____________ Movements: ____________ Start time: ____________ Finish time: ____________ °Date: ____________ Movements: ____________ Start time: ____________ Finish time: ____________ °Date: ____________ Movements: ____________ Start time: ____________ Finish time: ____________ °Date: ____________ Movements: ____________ Start time: ____________ Finish time: ____________ °Date: ____________ Movements: ____________ Start time: ____________ Finish time: ____________ °Date: ____________ Movements: ____________ Start time: ____________ Finish time: ____________ °Date: ____________ Movements: ____________ Start time: ____________ Finish time: ____________  °Date: ____________ Movements: ____________ Start time: ____________ Finish time: ____________ °Date: ____________ Movements: ____________ Start time: ____________ Finish time: ____________ °Date: ____________ Movements: ____________ Start time: ____________ Finish time: ____________ °Date: ____________ Movements: ____________ Start time: ____________ Finish time: ____________ °Date: ____________ Movements: ____________ Start time: ____________ Finish time: ____________ °Date: ____________ Movements: ____________ Start time: ____________ Finish time: ____________ °Date: ____________ Movements: ____________ Start time: ____________ Finish time: ____________  °Date:  ____________ Movements: ____________ Start time: ____________ Finish time: ____________ °Date: ____________ Movements: ____________ Start time: ____________ Finish time: ____________ °Date: ____________ Movements: ____________ Start time: ____________ Finish time: ____________ °Date: ____________ Movements: ____________ Start time: ____________ Finish time: ____________ °Date: ____________ Movements: ____________ Start time: ____________ Finish time: ____________ °Date: ____________ Movements: ____________ Start time: ____________ Finish time: ____________ °Date: ____________ Movements: ____________ Start time: ____________ Finish time: ____________  °Date: ____________ Movements: ____________ Start time: ____________ Finish time: ____________ °Date: ____________ Movements: ____________ Start time: ____________ Finish time: ____________ °Date: ____________ Movements: ____________ Start time: ____________ Finish time: ____________ °Date: ____________ Movements: ____________ Start time: ____________ Finish time: ____________ °Date: ____________ Movements: ____________ Start time: ____________ Finish time: ____________ °Date: ____________ Movements: ____________ Start time: ____________ Finish time: ____________ °  °This information is not intended to replace advice given to you by your health care provider. Make sure you discuss any questions you have with your health care provider. °  °Document Released: 02/24/2006 Document Revised: 02/15/2014 Document Reviewed: 11/22/2011 °[employer] Interactive Patient Education ©2016 [employer] °Braxton Hicks Contractions °Contractions of the uterus can occur throughout pregnancy. Contractions are not always a sign that you are in labor.  °WHAT ARE BRAXTON HICKS CONTRACTIONS?  °Contractions that occur before labor are called Braxton Hicks contractions, or false labor. Toward the end of pregnancy (32-34 weeks), these contractions can develop more often and may become more  forceful. This is not true labor because these contractions do not result in opening (dilatation) and thinning of the cervix. They are sometimes difficult to tell apart from true labor because these contractions can be forceful and people have different pain tolerances. You should   not feel embarrassed if you go to the hospital with false labor. Sometimes, the only way to tell if you are in true labor is for your health care provider to look for changes in the cervix. °If there are no prenatal problems or other health problems associated with the pregnancy, it is completely safe to be sent home with false labor and await the onset of true labor. °HOW CAN YOU TELL THE DIFFERENCE BETWEEN TRUE AND FALSE LABOR? °False Labor °· The contractions of false labor are usually shorter and not as hard as those of true labor.   °· The contractions are usually irregular.   °· The contractions are often felt in the front of the lower abdomen and in the groin.   °· The contractions may go away when you walk around or change positions while lying down.   °· The contractions get weaker and are shorter lasting as time goes on.   °· The contractions do not usually become progressively stronger, regular, and closer together as with true labor.   °True Labor °· Contractions in true labor last 30-70 seconds, become very regular, usually become more intense, and increase in frequency.   °· The contractions do not go away with walking.   °· The discomfort is usually felt in the top of the uterus and spreads to the lower abdomen and low back.   °· True labor can be determined by your health care provider with an exam. This will show that the cervix is dilating and getting thinner.   °WHAT TO REMEMBER °· Keep up with your usual exercises and follow other instructions given by your health care provider.   °· Take medicines as directed by your health care provider.   °· Keep your regular prenatal appointments.   °· Eat and drink lightly if you  think you are going into labor.   °· If Braxton Hicks contractions are making you uncomfortable:   °· Change your position from lying down or resting to walking, or from walking to resting.   °· Sit and rest in a tub of warm water.   °· Drink 2-3 glasses of water. Dehydration may cause these contractions.   °· Do slow and deep breathing several times an hour.   °WHEN SHOULD I SEEK IMMEDIATE MEDICAL CARE? °Seek immediate medical care if: °· Your contractions become stronger, more regular, and closer together.   °· You have fluid leaking or gushing from your vagina.   °· You have a fever.   °· You pass blood-tinged mucus.   °· You have vaginal bleeding.   °· You have continuous abdominal pain.   °· You have low back pain that you never had before.   °· You feel your baby's head pushing down and causing pelvic pressure.   °· Your baby is not moving as much as it used to.   °  °This information is not intended to replace advice given to you by your health care provider. Make sure you discuss any questions you have with your health care provider. °  °Document Released: 01/25/2005 Document Revised: 01/30/2013 Document Reviewed: 11/06/2012 °Elsevier Interactive Patient Education ©2016 [employer] ° °

## 2015-01-10 ENCOUNTER — Inpatient Hospital Stay (HOSPITAL_COMMUNITY): Payer: Medicaid Other | Admitting: Anesthesiology

## 2015-01-10 ENCOUNTER — Inpatient Hospital Stay (HOSPITAL_COMMUNITY)
Admission: AD | Admit: 2015-01-10 | Discharge: 2015-01-12 | DRG: 775 | Disposition: A | Discharge status: Home / Self Care | Payer: Medicaid Other | Source: Ambulatory Visit | Attending: Obstetrics and Gynecology | Admitting: Obstetrics and Gynecology

## 2015-01-10 ENCOUNTER — Encounter (HOSPITAL_COMMUNITY): Payer: Self-pay

## 2015-01-10 DIAGNOSIS — O480 Post-term pregnancy: Principal | ICD-10-CM | POA: Diagnosis present

## 2015-01-10 DIAGNOSIS — Z3A40 40 weeks gestation of pregnancy: Secondary | ICD-10-CM | POA: Diagnosis not present

## 2015-01-10 DIAGNOSIS — E669 Obesity, unspecified: Secondary | ICD-10-CM | POA: Diagnosis present

## 2015-01-10 DIAGNOSIS — Z6830 Body mass index (BMI) 30.0-30.9, adult: Secondary | ICD-10-CM | POA: Diagnosis not present

## 2015-01-10 DIAGNOSIS — O99214 Obesity complicating childbirth: Secondary | ICD-10-CM | POA: Diagnosis present

## 2015-01-10 DIAGNOSIS — Z3480 Encounter for supervision of other normal pregnancy, unspecified trimester: Secondary | ICD-10-CM

## 2015-01-10 LAB — RPR: RPR: NONREACTIVE

## 2015-01-10 LAB — TYPE AND SCREEN
ABO/RH(D): A POS
Antibody Screen: NEGATIVE

## 2015-01-10 LAB — CBC
HCT: 33.8 % — ABNORMAL LOW (ref 36.0–46.0)
HEMOGLOBIN: 11.2 g/dL — AB (ref 12.0–15.0)
MCH: 30.2 pg (ref 26.0–34.0)
MCHC: 33.1 g/dL (ref 30.0–36.0)
MCV: 91.1 fL (ref 78.0–100.0)
Platelets: 203 10*3/uL (ref 150–400)
RBC: 3.71 MIL/uL — ABNORMAL LOW (ref 3.87–5.11)
RDW: 13.8 % (ref 11.5–15.5)
WBC: 19.2 10*3/uL — ABNORMAL HIGH (ref 4.0–10.5)

## 2015-01-10 MED ORDER — FLEET ENEMA 7-19 GM/118ML RE ENEM: 1.0000 | ENEMA | RECTAL | Status: DC | PRN

## 2015-01-10 MED ORDER — ACETAMINOPHEN 325 MG PO TABS
650.0000 mg | ORAL_TABLET | ORAL | Status: DC | PRN
  Administered 2015-01-11: 650 mg via ORAL
  Filled 2015-01-10: qty 2

## 2015-01-10 MED ORDER — LACTATED RINGERS IV SOLN: INTRAVENOUS | Status: AC

## 2015-01-10 MED ORDER — ONDANSETRON HCL 4 MG/2ML IJ SOLN: 4.0000 mg | Freq: Four times a day (QID) | INTRAMUSCULAR | Status: DC | PRN

## 2015-01-10 MED ORDER — TERBUTALINE SULFATE 1 MG/ML IJ SOLN
INTRAMUSCULAR | Status: AC
  Filled 2015-01-10: qty 1

## 2015-01-10 MED ORDER — BUTORPHANOL TARTRATE 1 MG/ML IJ SOLN
1.0000 mg | Freq: Once | INTRAMUSCULAR | Status: AC
  Administered 2015-01-10: 1 mg via INTRAVENOUS

## 2015-01-10 MED ORDER — LIDOCAINE HCL (PF) 1 % IJ SOLN
30.0000 mL | INTRAMUSCULAR | Status: DC | PRN
  Filled 2015-01-10: qty 30

## 2015-01-10 MED ORDER — PRENATAL MULTIVITAMIN CH
1.0000 | ORAL_TABLET | Freq: Every day | ORAL | Status: DC
  Administered 2015-01-11 – 2015-01-12 (×2): 1 via ORAL
  Filled 2015-01-10 (×2): qty 1

## 2015-01-10 MED ORDER — DIPHENHYDRAMINE HCL 50 MG/ML IJ SOLN: 12.5000 mg | INTRAMUSCULAR | Status: DC | PRN

## 2015-01-10 MED ORDER — OXYTOCIN 40 UNITS IN LACTATED RINGERS INFUSION - SIMPLE MED
62.5000 mL/h | INTRAVENOUS | Status: DC
  Administered 2015-01-10: 62.5 mL/h via INTRAVENOUS
  Filled 2015-01-10: qty 1000

## 2015-01-10 MED ORDER — MEASLES, MUMPS & RUBELLA VAC ~~LOC~~ INJ
0.5000 mL | INJECTION | Freq: Once | SUBCUTANEOUS | Status: DC
  Filled 2015-01-10: qty 0.5

## 2015-01-10 MED ORDER — PRENATAL MULTIVITAMIN CH: 1.0000 | ORAL_TABLET | Freq: Every day | ORAL | Status: DC

## 2015-01-10 MED ORDER — ALBUTEROL SULFATE (2.5 MG/3ML) 0.083% IN NEBU: 3.0000 mL | INHALATION_SOLUTION | Freq: Four times a day (QID) | RESPIRATORY_TRACT | Status: DC | PRN

## 2015-01-10 MED ORDER — SENNOSIDES-DOCUSATE SODIUM 8.6-50 MG PO TABS
2.0000 | ORAL_TABLET | ORAL | Status: DC
  Administered 2015-01-10 – 2015-01-12 (×2): 2 via ORAL
  Filled 2015-01-10 (×2): qty 2

## 2015-01-10 MED ORDER — PHENYLEPHRINE 40 MCG/ML (10ML) SYRINGE FOR IV PUSH (FOR BLOOD PRESSURE SUPPORT)
80.0000 ug | PREFILLED_SYRINGE | INTRAVENOUS | Status: DC | PRN
  Administered 2015-01-10: 80 ug via INTRAVENOUS
  Filled 2015-01-10: qty 20
  Filled 2015-01-10: qty 2

## 2015-01-10 MED ORDER — ACETAMINOPHEN 325 MG PO TABS: 650.0000 mg | ORAL_TABLET | ORAL | Status: DC | PRN

## 2015-01-10 MED ORDER — FENTANYL 2.5 MCG/ML BUPIVACAINE 1/10 % EPIDURAL INFUSION (WH - ANES)
14.0000 mL/h | INTRAMUSCULAR | Status: DC | PRN
  Administered 2015-01-10: 14 mL/h via EPIDURAL
  Filled 2015-01-10: qty 125

## 2015-01-10 MED ORDER — ZOLPIDEM TARTRATE 5 MG PO TABS: 5.0000 mg | ORAL_TABLET | Freq: Every evening | ORAL | Status: DC | PRN

## 2015-01-10 MED ORDER — LACTATED RINGERS IV SOLN
500.0000 mL | INTRAVENOUS | Status: DC | PRN
  Administered 2015-01-10 (×3): 500 mL via INTRAVENOUS

## 2015-01-10 MED ORDER — OXYCODONE-ACETAMINOPHEN 5-325 MG PO TABS: 2.0000 | ORAL_TABLET | ORAL | Status: DC | PRN

## 2015-01-10 MED ORDER — DIPHENHYDRAMINE HCL 25 MG PO CAPS: 25.0000 mg | ORAL_CAPSULE | Freq: Four times a day (QID) | ORAL | Status: DC | PRN

## 2015-01-10 MED ORDER — BENZOCAINE-MENTHOL 20-0.5 % EX AERO
1.0000 "application " | INHALATION_SPRAY | CUTANEOUS | Status: DC | PRN
  Filled 2015-01-10: qty 56

## 2015-01-10 MED ORDER — FAMOTIDINE 20 MG PO TABS
20.0000 mg | ORAL_TABLET | Freq: Every day | ORAL | Status: AC
  Administered 2015-01-10 – 2015-01-12 (×3): 20 mg via ORAL
  Filled 2015-01-10 (×3): qty 1

## 2015-01-10 MED ORDER — LANOLIN HYDROUS EX OINT: TOPICAL_OINTMENT | CUTANEOUS | Status: DC | PRN

## 2015-01-10 MED ORDER — ONDANSETRON HCL 4 MG/2ML IJ SOLN: 4.0000 mg | INTRAMUSCULAR | Status: DC | PRN

## 2015-01-10 MED ORDER — DIBUCAINE 1 % RE OINT: 1.0000 "application " | TOPICAL_OINTMENT | RECTAL | Status: DC | PRN

## 2015-01-10 MED ORDER — CITRIC ACID-SODIUM CITRATE 334-500 MG/5ML PO SOLN: 30.0000 mL | ORAL | Status: DC | PRN

## 2015-01-10 MED ORDER — LACTATED RINGERS IV SOLN
INTRAVENOUS | Status: DC
  Administered 2015-01-10 (×2): via INTRAVENOUS

## 2015-01-10 MED ORDER — OXYTOCIN BOLUS FROM INFUSION: 500.0000 mL | INTRAVENOUS | Status: DC

## 2015-01-10 MED ORDER — OXYCODONE-ACETAMINOPHEN 5-325 MG PO TABS
1.0000 | ORAL_TABLET | ORAL | Status: DC | PRN
  Administered 2015-01-10 – 2015-01-12 (×4): 1 via ORAL
  Filled 2015-01-10 (×4): qty 1

## 2015-01-10 MED ORDER — IBUPROFEN 600 MG PO TABS
600.0000 mg | ORAL_TABLET | Freq: Four times a day (QID) | ORAL | Status: DC
  Administered 2015-01-10 – 2015-01-12 (×8): 600 mg via ORAL
  Filled 2015-01-10 (×9): qty 1

## 2015-01-10 MED ORDER — EPHEDRINE 5 MG/ML INJ
10.0000 mg | INTRAVENOUS | Status: DC | PRN
  Administered 2015-01-10: 10 mg via INTRAVENOUS
  Filled 2015-01-10: qty 2
  Filled 2015-01-10: qty 4

## 2015-01-10 MED ORDER — TETANUS-DIPHTH-ACELL PERTUSSIS 5-2.5-18.5 LF-MCG/0.5 IM SUSP
0.5000 mL | Freq: Once | INTRAMUSCULAR | Status: AC
  Administered 2015-01-11: 0.5 mL via INTRAMUSCULAR
  Filled 2015-01-10: qty 0.5

## 2015-01-10 MED ORDER — ONDANSETRON HCL 4 MG PO TABS
4.0000 mg | ORAL_TABLET | ORAL | Status: DC | PRN
Start: 2015-01-10 — End: 2015-01-12

## 2015-01-10 MED ORDER — OXYTOCIN 40 UNITS IN LACTATED RINGERS INFUSION - SIMPLE MED
62.5000 mL/h | INTRAVENOUS | Status: AC
  Administered 2015-01-10: 62.5 mL/h via INTRAVENOUS
  Filled 2015-01-10: qty 1000

## 2015-01-10 MED ORDER — OXYCODONE-ACETAMINOPHEN 5-325 MG PO TABS: 1.0000 | ORAL_TABLET | ORAL | Status: DC | PRN

## 2015-01-10 MED ORDER — LIDOCAINE HCL (PF) 1 % IJ SOLN
INTRAMUSCULAR | Status: DC | PRN
  Administered 2015-01-10: 7 mL via EPIDURAL
  Administered 2015-01-10: 8 mL via EPIDURAL

## 2015-01-10 MED ORDER — INFLUENZA VAC SPLIT QUAD 0.5 ML IM SUSY
0.5000 mL | PREFILLED_SYRINGE | INTRAMUSCULAR | Status: AC
  Administered 2015-01-12: 0.5 mL via INTRAMUSCULAR
  Filled 2015-01-10: qty 0.5

## 2015-01-10 MED ORDER — WITCH HAZEL-GLYCERIN EX PADS: 1.0000 "application " | MEDICATED_PAD | CUTANEOUS | Status: DC | PRN

## 2015-01-10 MED ORDER — SIMETHICONE 80 MG PO CHEW: 80.0000 mg | CHEWABLE_TABLET | ORAL | Status: DC | PRN

## 2015-01-10 NOTE — Progress Notes (Signed)
Patient ID: Sierra NevinShawnetta D Hodge, female   DOB: Dec 15, 1985, 29 y.o.   MRN:  afebrile BP normal no complaints

## 2015-01-10 NOTE — Progress Notes (Signed)
Patient ID: Angela NevinShawnetta D Houde, female   DOB: 12-21-1985, 29 y.o.   MRN: [id number] I was called to her room because of 5 minute bradycardia after her epidural. She hasd been given ephedrine and phenylephrine. The FHR recovered A scalp electrode was placed by the RN  To better evaluate the FHR Cervix 6 cm 80% effaced and the vertex is at - 1 station. AROM  Was done with the scalp electrode insertion and the fluid was clear.

## 2015-01-10 NOTE — Progress Notes (Addendum)
Patient ID: Angela NevinShawnetta D Barlett, female   DOB: 12/05/1985, 29 y.o.   MRN: [id number] Delivery note:  The pt became fully dilated and the vertex descended to the perineum without pushing. She pushed with 1 contraction and delivered a living female infant OA over an intact perineum. Apgars were 9 and 9 at 1 and 5 minutes. The placenta was intact and the uterus was normal. There was a small laceration superior to the urethral meatus that was hemostatic and not repaired. EBL 450 cc's.There was 1 loose loop of nuchal cord.

## 2015-01-10 NOTE — Progress Notes (Signed)
Notified of pt arrival in [facility]U and exam. Will admit to labor and delivery.  

## 2015-01-10 NOTE — Anesthesia Procedure Notes (Signed)
Epidural Patient location during procedure: OB Start time: 01/10/2015 3:18 AM End time: 01/10/2015 3:22 AM  Staffing Anesthesiologist: Leilani AbleHATCHETT, Jyll Tomaro Performed by: anesthesiologist   Preanesthetic Checklist Completed: patient identified, surgical consent, pre-op evaluation, timeout performed, IV checked, risks and benefits discussed and monitors and equipment checked  Epidural Patient position: sitting Prep: site prepped and draped and DuraPrep Patient monitoring: continuous pulse ox and blood pressure Approach: midline Location: L3-L4 Injection technique: LOR air  Needle:  Needle type: Tuohy  Needle gauge: 17 G Needle length: 9 cm and 9 Needle insertion depth: 6 cm Catheter type: closed end flexible Catheter size: 19 Gauge Catheter at skin depth: 11 cm Test dose: negative and Other  Assessment Sensory level: T9 Events: blood not aspirated, injection not painful, no injection resistance, negative IV test and no paresthesia  Additional Notes Reason for block:procedure for pain

## 2015-01-10 NOTE — Anesthesia Postprocedure Evaluation (Signed)
Anesthesia Post Note  Patient: [name]  Procedure(s) Performed: * No procedures listed *  Patient location during evaluation: Mother Baby Anesthesia Type: Epidural Level of consciousness: awake, awake and alert and oriented Pain management: pain level controlled Vital Signs Assessment: post-procedure vital signs reviewed and stable Respiratory status: spontaneous breathing, nonlabored ventilation and respiratory function stable Cardiovascular status: stable Postop Assessment: no backache, no headache, adequate PO intake, no signs of nausea or vomiting and patient able to bend at knees Anesthetic complications: no    Last Vitals:  Filed Vitals:   01/10/15 0653 01/10/15 0822  BP: 124/65 125/58  Pulse: 83 84  Temp: 36.8 C 36.8 C  Resp: 18 18    Last Pain:  Filed Vitals:   01/10/15 0822  PainSc: 0-No pain                 Anshu Wehner

## 2015-01-10 NOTE — Anesthesia Preprocedure Evaluation (Signed)
Anesthesia Evaluation  Patient identified by MRN, date of birth, ID band Patient awake    Reviewed: Allergy & Precautions, H&P , NPO status , Patient's Chart, lab work & pertinent test results  Airway Mallampati: I  TM Distance: >3 FB Neck ROM: full    Dental no notable dental hx.    Pulmonary neg pulmonary ROS,    Pulmonary exam normal        Cardiovascular negative cardio ROS Normal cardiovascular exam     Neuro/Psych negative neurological ROS     GI/Hepatic negative GI ROS, Neg liver ROS,   Endo/Other  negative endocrine ROS  Renal/GU negative Renal ROS     Musculoskeletal   Abdominal (+) + obese,   Peds  Hematology negative hematology ROS (+)   Anesthesia Other Findings   Reproductive/Obstetrics (+) Pregnancy                             Anesthesia Physical Anesthesia Plan  ASA: II  Anesthesia Plan: Epidural   Post-op Pain Management:    Induction:   Airway Management Planned:   Additional Equipment:   Intra-op Plan:   Post-operative Plan:   Informed Consent: I have reviewed the patients History and Physical, chart, labs and discussed the procedure including the risks, benefits and alternatives for the proposed anesthesia with the patient or authorized representative who has indicated his/her understanding and acceptance.     Plan Discussed with:   Anesthesia Plan Comments:         Anesthesia Quick Evaluation  

## 2015-01-10 NOTE — H&P (Signed)
[name], [name]           ACCOUNT NO.:  [id number]  MEDICAL RECORD NO.:  [id number]  LOCATION:  9166                          FACILITY:  [facility]  PHYSICIAN:  Malachi Pro. Ambrose Mantle, M.D. DATE OF BIRTH:  [birth year]  DATE OF ADMISSION:  01/10/2015 DATE OF DISCHARGE:                             HISTORY & PHYSICAL   PRESENT ILLNESS:  This is a 29 year old black female, para 1-0-3-1, gravida 5 with [facility] January 10, 2015, admitted in labor.  The patient's [facility] was confirmed at January 10, 2015, based on an ultrasound at 5 weeks and 6 days on May 16, 2014.  Blood group and type is A positive with a negative antibody.  RPR negative.  Urine culture negative.  Hepatitis B surface antigen negative.  HIV negative.  GC and Chlamydia negative. Rubella immune.  Hemoglobin electrophoresis AA.  First trimester screen was negative.  One-hour Glucola 70.  Repeat HIV and RPR negative.  Group B strep negative.  The patient began her prenatal course early.  Had an ultrasound on May 16, 2014; 5 weeks and 6 days with an [facility] of January 09, 2015.  January 10, 2015 was chosen based on a last period of April 05, 2014.  At 30 weeks, the patient complained of feeling as if the baby was in her vagina, but the cervix was long and close, and the presenting part was out of the pelvis.  At 34 weeks, she was noted to have a 34 pounds weight gain, 6 pounds in the last 2 weeks and she was advised to decrease salt.  Anatomy ultrasound on November 29, 2014, showed good growth in the 56 percentile.  The baby was thought to weigh 5 pounds and 8 ounces.  On December 20, 2014, the cervix was thought to be 4 cm, 70%. Five days later, she was thought to be 3 cm and 70%.  At 39 weeks and 3 days on January 06, 2015, she was 4 cm and again on January 08, 2015, was thought to be 4 cm.  She came to the hospital on January 09, 2015, was observed for 2 hours.  The cervix did not change from 4 cm.  She was discharged.  She returned  again having severe pain with contractions and the cervix was thought by the maternity admission nurse to be 5 cm.  She was admitted and brought to Labor and Delivery.  PAST MEDICAL HISTORY:  Reveals anemia with pregnancy, has a history of depression onset at age 21 and started counseling.  She had a suicide attempt in 2007.  SURGICAL HISTORY:  D and C x3 for terminations.  She had an adenoidectomy and tonsillectomy at age 9.  ALLERGIES:  SHE IS ALLERGIC TO ADHESIVE TAPE and with a contraceptive patch in 2016.  She has no known drug allergies.  No latex or food allergies.  SOCIAL HISTORY:  The patient was never a smoker.  Does not drink. Denies illegal drugs.  She has 2 years of college in cosmetology and is in nursing school.  She has completed a CNA.  At the time of her pregnancy onset, she was unemployed.  FAMILY HISTORY:  Her maternal grandmother had stomach cancer and depression.  Maternal  grandfather, cancer of the lung.  Father, malignant tumor of the lung and died at age 29.  Mother, MI at age 29, high blood pressure and aneurysm in the brain x2.  Maternal aunt, cerebrovascular accident.  PHYSICAL EXAMINATION:  VITAL SIGNS:  On admission, her vital signs were temperature 97.8, pulse 98, respirations 18, blood pressure 136/88. HEART:  Normal sounds. LUNGS:  Clear to auscultation. ABDOMEN:  Soft.  At her last prenatal visit, the fundal height appeared compatible with her dates.  The fundal height was 39.5 cm.  Fetal heart tones were normal.  The cervix by the MAU nurse was 5 cm, 70%, bulging bag of waters.  ADMITTING IMPRESSION:  Intrauterine pregnancy at term, active labor. The patient is prepared to observe for progress in labor.     Malachi Prohomas F. Ambrose MantleHenley, M.D.     TFH/MEDQ  D:  01/10/2015  T:  01/10/2015  Job:  [id number]

## 2015-01-10 NOTE — MAU Note (Signed)
[facility] Will go to 166

## 2015-01-10 NOTE — Progress Notes (Signed)
UR chart review completed.  

## 2015-01-11 ENCOUNTER — Inpatient Hospital Stay (HOSPITAL_COMMUNITY)
Admission: RE | Admit: 2015-01-11 | Discharge: 2015-01-11 | Disposition: A | Discharge status: Home / Self Care | Payer: Medicaid Other | Source: Ambulatory Visit | Attending: Obstetrics and Gynecology | Admitting: Obstetrics and Gynecology

## 2015-01-11 DIAGNOSIS — Z3483 Encounter for supervision of other normal pregnancy, third trimester: Secondary | ICD-10-CM

## 2015-01-11 HISTORY — DX: Encounter for supervision of other normal pregnancy, third trimester: Z34.83

## 2015-01-11 LAB — CBC
HCT: 28.8 % — ABNORMAL LOW (ref 36.0–46.0)
HEMOGLOBIN: 9.4 g/dL — AB (ref 12.0–15.0)
MCH: 30 pg (ref 26.0–34.0)
MCHC: 32.6 g/dL (ref 30.0–36.0)
MCV: 92 fL (ref 78.0–100.0)
Platelets: 236 10*3/uL (ref 150–400)
RBC: 3.13 MIL/uL — ABNORMAL LOW (ref 3.87–5.11)
RDW: 14.2 % (ref 11.5–15.5)
WBC: 15.1 10*3/uL — ABNORMAL HIGH (ref 4.0–10.5)

## 2015-01-11 NOTE — Progress Notes (Signed)
Post Partum Day 1 Subjective: no complaints, up ad lib, tolerating PO and nl lochia, pain controlled  Objective: Blood pressure 123/65, pulse 71, temperature 97.4 F (36.3 C), temperature source Oral, resp. rate 18, last menstrual period 04/05/2014, SpO2 100 %, unknown if currently breastfeeding.  Physical Exam:  General: alert and no distress Lochia: appropriate Uterine Fundus: firm   Recent Labs  01/10/15 0254 01/11/15 0540  HGB 11.2* 9.4*  HCT 33.8* 28.8*    Assessment/Plan: Plan for discharge tomorrow, Breastfeeding and Lactation consult   LOS: 1 day   Bovard-Stuckert, Garrett Mitchum 01/11/2015, 9:11 AM

## 2015-01-12 MED ORDER — OXYCODONE-ACETAMINOPHEN 5-325 MG PO TABS: 1.0000 | ORAL_TABLET | Freq: Four times a day (QID) | ORAL | Status: DC | PRN

## 2015-01-12 MED ORDER — BISACODYL 10 MG RE SUPP
10.0000 mg | Freq: Once | RECTAL | Status: AC
  Administered 2015-01-12: 10 mg via RECTAL
  Filled 2015-01-12: qty 1

## 2015-01-12 MED ORDER — PRENATAL MULTIVITAMIN CH: 1.0000 | ORAL_TABLET | Freq: Every day | ORAL | Status: DC

## 2015-01-12 MED ORDER — IBUPROFEN 800 MG PO TABS: 800.0000 mg | ORAL_TABLET | Freq: Three times a day (TID) | ORAL | Status: DC | PRN

## 2015-01-12 NOTE — Discharge Summary (Signed)
OB Discharge Summary     Patient Name: [name] DOB: 1985/03/08 MRN: [id number]  Date of admission: 01/10/2015 Delivering MD: Tracey Harries   Date of discharge: 01/12/2015  Admitting diagnosis: 39 WEEKS CTX Intrauterine pregnancy: [redacted]w[redacted]d     Secondary diagnosis:  Active Problems:   Normal labor and delivery   SVD (spontaneous vaginal delivery)   Normal pregnancy, repeat  Additional problems: N/A     Discharge diagnosis: Term Pregnancy Delivered                                                                                                Post partum procedures:N/A  Augmentation: N/A  Complications: None  Hospital course:  Onset of Labor With Vaginal Delivery     29 y.o. yo [id number] at [redacted]w[redacted]d was admitted in Active Laboron 01/10/2015. Patient had an uncomplicated labor course as follows:  Membrane Rupture Time/Date: 3:38 AM ,01/10/2015   Intrapartum Procedures: Episiotomy: None [1]                                         Lacerations:  Periurethral [8]  Patient had a delivery of a Viable infant. 01/10/2015  Information for the patient's newborn:  Sierra, [name] [[id number]]  Delivery Method: Vaginal, Spontaneous Delivery (Filed from Delivery Summary)    Pateint had an uncomplicated postpartum course.  She is ambulating, tolerating a regular diet, passing flatus, and urinating well. Patient is discharged home in stable condition on No discharge date for patient encounter.Marland Kitchen    Physical exam  Filed Vitals:   01/11/15 0530 01/11/15 1900 01/11/15 1918 01/12/15 0650  BP: 123/65 123/71 123/64 137/81  Pulse: 71 68 73 72  Temp: 97.4 F (36.3 C) 98.5 F (36.9 C) 98.3 F (36.8 C) 98.4 F (36.9 C)  TempSrc: Oral Oral Oral Oral  Resp: SpO2:  100% 99% 99%   General: alert and no distress Lochia: appropriate Uterine Fundus: firm  Labs: Lab Results  Component Value Date   WBC 15.1* 01/11/2015   HGB 9.4* 01/11/2015   HCT 28.8* 01/11/2015   MCV  92.0 01/11/2015   PLT 236 01/11/2015   CMP Latest Ref Rng 01/01/2015  Glucose 65 - 99 mg/dL 96  BUN 6 - 20 mg/dL 8  Creatinine 1.30 - 8.65 mg/dL 7.84  Sodium 696 - 295 mmol/L 133(L)  Potassium 3.5 - 5.1 mmol/L 3.5  Chloride 101 - 111 mmol/L 106  CO2 22 - 32 mmol/L 19(L)  Calcium 8.9 - 10.3 mg/dL 2.8(U)  Total Protein 6.5 - 8.1 g/dL 7.0  Total Bilirubin 0.3 - 1.2 mg/dL 0.8  Alkaline Phos 38 - 126 U/L 111  AST 15 - 41 U/L 21  ALT 14 - 54 U/L 12(L)    Discharge instruction: per After Visit Summary and "Baby and Me Booklet".  After visit meds:    Medication List    TAKE these medications        albuterol 108 (90 BASE) MCG/ACT inhaler  Commonly known as:  PROVENTIL HFA;VENTOLIN HFA  Inhale 2 puffs into the lungs every 6 (six) hours as needed for wheezing or shortness of breath.     ibuprofen 800 MG tablet  Commonly known as:  ADVIL,MOTRIN  Take 1 tablet (800 mg total) by mouth every 8 (eight) hours as needed.     oxyCODONE-acetaminophen 5-325 MG tablet  Commonly known as:  PERCOCET/ROXICET  Take 1-2 tablets by mouth every 6 (six) hours as needed for severe pain.     prenatal multivitamin Tabs tablet  Take 1 tablet by mouth daily at 12 noon.     ranitidine 75 MG tablet  Commonly known as:  ZANTAC  Take 75 mg by mouth daily as needed for heartburn.        Diet: routine diet  Activity: Advance as tolerated. Pelvic rest for 6 weeks.   Outpatient follow up:6 weeks Follow up Appt:No future appointments. Follow up Visit:No Follow-up on file.  Postpartum contraception: Not Discussed  Newborn Data: Live born female  Birth Weight: 8 lb 2.2 oz (3691 g) APGAR: 9, 9  Baby Feeding: Bottle Disposition:home with mother   01/12/2015 Sherian ReinBovard-Stuckert, Kattia Selley, MD

## 2015-01-12 NOTE — Progress Notes (Addendum)
Prior to D/C, this RN obtained an order for a suppository that was given at 1200. Patient has not had a BM since 11/27, bowel sounds audible all 4 quadrants, able to pass gas, and in no discomfort when abdomen is palpated. Patient reported having a BM within 15 minutes of suppository.

## 2015-01-12 NOTE — Progress Notes (Signed)
CLINICAL SOCIAL WORK MATERNAL/CHILD NOTE  Patient Details  Name: [name] MRN: [id number] Date of Birth: 01/10/2015  Date: 01/12/2015  Clinical Social Worker Initiating Note: [name], LCSWDate/ Time Initiated: 01/12/15/1000   Child's Name: [name]   Legal Guardian:  (Parents [name] and [name])   Need for Interpreter: None   Date of Referral: 01/11/15   Reason for Referral: Other (Comment)   Referral Source: [facility]   Address: [address] 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 Met with mother who was pleasant and receptive to social work. She is a single parent with one other dependent age 70. MOB reports hx of anxiety and depression mostly during her teenage years. She also reports being hospitalized in a mental health facility, but stayed only for 24 hours. Informed that she was prescribed medication but took them only for a few months because she didn't like the way she felt on the medication. She denies any SI, HI, or current symptoms of depression or anxiety. She denies any use of alcohol or illicit drug use during pregnancy. Mother states that her  social situation improved when she returned home to live with her mother 3 years ago. She reports having an excellent support system. FOB is employed and reportedly supportive. She denies any hx of DV. She seemed guarded about her social history. She spoke with excitement about newborn and her goals to continue her college education. No acute social concerns noted or reported at this time. Affect and behavior was appropriate during the entire visit. Mother informed of social work Fish farm manager.  [name] Plan/Description:    Discussed signs/symptoms of PP Depression and available resources No further intervention required No barriers to discharge   [name] J, LCSW 01/12/2015, 4:16 PM

## 2015-01-12 NOTE — Progress Notes (Signed)
Post Partum Day 2 Subjective: no complaints, up ad lib, tolerating PO and nl lochia, pain controlled  Objective: Blood pressure 137/81, pulse 72, temperature 98.4 F (36.9 C), temperature source Oral, resp. rate 16, last menstrual period 04/05/2014, SpO2 99 %, unknown if currently breastfeeding.  Physical Exam:  General: alert and no distress Lochia: appropriate Uterine Fundus: firm   Recent Labs  01/10/15 0254 01/11/15 0540  HGB 11.2* 9.4*  HCT 33.8* 28.8*    Assessment/Plan: Discharge home, Breastfeeding and Lactation consult.  Routine care.  D/c with motrin, percocet and PNV.  F/u 6 weeks   LOS: 2 days   [name], Sierra Weimer 01/12/2015, 8:05 AM

## 2015-08-03 ENCOUNTER — Emergency Department (HOSPITAL_COMMUNITY): Payer: Medicaid Other

## 2015-08-03 ENCOUNTER — Emergency Department (HOSPITAL_COMMUNITY)
Admission: EM | Admit: 2015-08-03 | Discharge: 2015-08-04 | Disposition: A | Discharge status: Home / Self Care | Payer: Medicaid Other | Attending: Emergency Medicine | Admitting: Emergency Medicine

## 2015-08-03 ENCOUNTER — Encounter (HOSPITAL_COMMUNITY): Payer: Self-pay | Admitting: Emergency Medicine

## 2015-08-03 DIAGNOSIS — J45909 Unspecified asthma, uncomplicated: Secondary | ICD-10-CM | POA: Diagnosis not present

## 2015-08-03 DIAGNOSIS — Z7982 Long term (current) use of aspirin: Secondary | ICD-10-CM | POA: Insufficient documentation

## 2015-08-03 DIAGNOSIS — F329 Major depressive disorder, single episode, unspecified: Secondary | ICD-10-CM | POA: Diagnosis not present

## 2015-08-03 DIAGNOSIS — R631 Polydipsia: Secondary | ICD-10-CM | POA: Insufficient documentation

## 2015-08-03 DIAGNOSIS — R51 Headache: Secondary | ICD-10-CM | POA: Diagnosis present

## 2015-08-03 DIAGNOSIS — R350 Frequency of micturition: Secondary | ICD-10-CM | POA: Diagnosis not present

## 2015-08-03 DIAGNOSIS — R110 Nausea: Secondary | ICD-10-CM | POA: Diagnosis not present

## 2015-08-03 DIAGNOSIS — R519 Headache, unspecified: Secondary | ICD-10-CM

## 2015-08-03 DIAGNOSIS — Z7951 Long term (current) use of inhaled steroids: Secondary | ICD-10-CM | POA: Insufficient documentation

## 2015-08-03 DIAGNOSIS — H53149 Visual discomfort, unspecified: Secondary | ICD-10-CM | POA: Insufficient documentation

## 2015-08-03 DIAGNOSIS — Z79899 Other long term (current) drug therapy: Secondary | ICD-10-CM | POA: Diagnosis not present

## 2015-08-03 LAB — URINALYSIS, ROUTINE W REFLEX MICROSCOPIC
Bilirubin Urine: NEGATIVE
Glucose, UA: NEGATIVE mg/dL
Hgb urine dipstick: NEGATIVE
Ketones, ur: NEGATIVE mg/dL
Nitrite: NEGATIVE
PROTEIN: NEGATIVE mg/dL
Specific Gravity, Urine: 1.029 (ref 1.005–1.030)
pH: 7.5 (ref 5.0–8.0)

## 2015-08-03 LAB — CBC WITH DIFFERENTIAL/PLATELET
BASOS ABS: 0 10*3/uL (ref 0.0–0.1)
BASOS PCT: 0 %
Eosinophils Absolute: 0.2 10*3/uL (ref 0.0–0.7)
Eosinophils Relative: 2 %
HEMATOCRIT: 35.5 % — AB (ref 36.0–46.0)
HEMOGLOBIN: 11.5 g/dL — AB (ref 12.0–15.0)
Lymphocytes Relative: 33 %
Lymphs Abs: 3.1 10*3/uL (ref 0.7–4.0)
MCH: 29 pg (ref 26.0–34.0)
MCHC: 32.4 g/dL (ref 30.0–36.0)
MCV: 89.6 fL (ref 78.0–100.0)
Monocytes Absolute: 0.5 10*3/uL (ref 0.1–1.0)
Monocytes Relative: 5 %
NEUTROS ABS: 5.6 10*3/uL (ref 1.7–7.7)
NEUTROS PCT: 60 %
Platelets: 283 10*3/uL (ref 150–400)
RBC: 3.96 MIL/uL (ref 3.87–5.11)
RDW: 13.5 % (ref 11.5–15.5)
WBC: 9.3 10*3/uL (ref 4.0–10.5)

## 2015-08-03 LAB — COMPREHENSIVE METABOLIC PANEL
ALK PHOS: 56 U/L (ref 38–126)
ALT: 15 U/L (ref 14–54)
ANION GAP: 5 (ref 5–15)
AST: 17 U/L (ref 15–41)
Albumin: 4.2 g/dL (ref 3.5–5.0)
BILIRUBIN TOTAL: 0.6 mg/dL (ref 0.3–1.2)
BUN: 17 mg/dL (ref 6–20)
CALCIUM: 8.6 mg/dL — AB (ref 8.9–10.3)
CO2: 24 mmol/L (ref 22–32)
Chloride: 110 mmol/L (ref 101–111)
Creatinine, Ser: 0.83 mg/dL (ref 0.44–1.00)
GFR calc non Af Amer: >60 mL/min (ref >60)
Glucose, Bld: 86 mg/dL (ref 65–99)
Potassium: 3.9 mmol/L (ref 3.5–5.1)
SODIUM: 139 mmol/L (ref 135–145)
TOTAL PROTEIN: 7.2 g/dL (ref 6.5–8.1)

## 2015-08-03 LAB — URINE MICROSCOPIC-ADD ON

## 2015-08-03 LAB — CBG MONITORING, ED: Glucose-Capillary: 99 mg/dL (ref 65–99)

## 2015-08-03 LAB — PREGNANCY, URINE: PREG TEST UR: NEGATIVE

## 2015-08-03 MED ORDER — SODIUM CHLORIDE 0.9 % IV BOLUS (SEPSIS)
1000.0000 mL | Freq: Once | INTRAVENOUS | Status: AC
  Administered 2015-08-03: 1000 mL via INTRAVENOUS

## 2015-08-03 MED ORDER — DEXAMETHASONE SODIUM PHOSPHATE 10 MG/ML IJ SOLN
10.0000 mg | Freq: Once | INTRAMUSCULAR | Status: AC
  Administered 2015-08-03: 10 mg via INTRAVENOUS
  Filled 2015-08-03: qty 1

## 2015-08-03 MED ORDER — DIPHENHYDRAMINE HCL 50 MG/ML IJ SOLN
25.0000 mg | Freq: Once | INTRAMUSCULAR | Status: AC
  Administered 2015-08-03: 25 mg via INTRAVENOUS
  Filled 2015-08-03: qty 1

## 2015-08-03 MED ORDER — METOCLOPRAMIDE HCL 5 MG/ML IJ SOLN
10.0000 mg | Freq: Once | INTRAMUSCULAR | Status: AC
  Administered 2015-08-03: 10 mg via INTRAVENOUS
  Filled 2015-08-03: qty 2

## 2015-08-03 NOTE — ED Notes (Signed)
Patient is complaining of being thirsty a lot, a headache for the last 4 days, hot flashes, leg cramps while driving. Patient states that she has taken bayer Asprin

## 2015-08-03 NOTE — ED Notes (Signed)
Upon standing for orthostatic vs pt stated her head pain was worse and experienced some dizziness

## 2015-08-03 NOTE — ED Provider Notes (Signed)
CSN: [id number]     Arrival date & time 08/03/15  1947 History   First MD Initiated Contact with Patient 08/03/15 2110     Chief Complaint  Patient presents with  . Polydipsia  . Headache     (Consider location/radiation/quality/duration/timing/severity/associated sxs/prior Treatment) Patient is a 30 y.o. female presenting with headaches. The history is provided by the patient.  Headache Pain location:  Frontal Quality: achy, pounding. Radiates to: right eye, temple, and to right postauricular area. Severity currently:  8/10 Severity at highest:  4/10 Onset quality:  Gradual Duration:  4 days Timing:  Constant Progression:  Worsening Chronicity:  Recurrent Similar to prior headaches: yes   Context: activity   Context: not coughing, not eating and not loud noise   Relieved by:  Nothing Worsened by:  Activity and light Ineffective treatments:  Aspirin Associated symptoms: blurred vision, dizziness, nausea, near-syncope and photophobia   Associated symptoms: no abdominal pain, no back pain, no cough, no diarrhea, no drainage, no ear pain, no eye pain, no facial pain, no fatigue, no fever, no focal weakness, no hearing loss, no loss of balance, no myalgias, no neck pain, no neck stiffness, no numbness, no paresthesias, no seizures, no sinus pressure, no sore throat, no swollen glands, no syncope, no URI, no visual change, no vomiting and no weakness     Patient is a [age] year old female presents to the emergency room for evaluation of headache which has been constant for the past 4 days. Her headache is located across her forehead behind her right eye and her right temple and does radiate the right occipital area and behind her ear. He is currently rated 8 out of 10 with an achy throbbing quality. It is exacerbated with movement and bright lights. Patient denies it being positional. She has treated with aspirin and lying down in a dark room but the headache continued to gradually worsen.  [place] and felt d[place]y, had cramping in her leg. She states she is "not sure what happened" but her and found her asleep in her car at her destination. This occurred at 5 PM today. She was able to drive back to [place] without any difficulty. She also complains of hot flashes and night sweats which have been ongoing for the past 6 months. She states that over the past week she has had excessive thirst and frequent urination with dark color but states she has not had any dysuria, hematuria, abdominal pain, flank pain. No vaginal symptoms.   Past Medical History  Diagnosis Date  . Asthma   . Chlamydia   . Anxiety   . Anemia   . Hx of varicella   . Depression     suicide att in 2007  . Normal pregnancy in multigravida in third trimester 01/09/2015   Past Surgical History  Procedure Laterality Date  . Tonsillectomy    . Wisdom tooth extraction    . Dilation and curettage of uterus     Family History  Problem Relation Age of Onset  . Hypertension Mother   . Depression Maternal Aunt   . Hypertension Maternal Aunt   . Stroke Maternal Aunt   . Hypertension Maternal Grandmother   . Alcohol abuse Neg Hx   . Arthritis Neg Hx   . Asthma Neg Hx   . Birth defects Neg Hx   . Cancer Neg Hx   . COPD Neg Hx   . Diabetes Neg Hx   . Drug abuse Neg  Hx   . Early death Neg Hx   . Hearing loss Neg Hx   . Heart disease Neg Hx   . Hyperlipidemia Neg Hx   . Kidney disease Neg Hx   . Learning disabilities Neg Hx   . Mental illness Neg Hx   . Mental retardation Neg Hx   . Miscarriages / Stillbirths Neg Hx   . Vision loss Neg Hx   . Varicose Veins Neg Hx    Social History  Substance Use Topics  . Smoking status: Never Smoker   . Smokeless tobacco: Never Used  . Alcohol Use: Yes     Comment: occ   OB History    Gravida Para Term Preterm AB TAB SAB Ectopic Multiple Living   5 2 2  3 3    0 2     Review of Systems  Constitutional: Negative for fever  and fatigue.  HENT: Negative for ear pain, hearing loss, postnasal drip, sinus pressure and sore throat.   Eyes: Positive for blurred vision and photophobia. Negative for pain.  Respiratory: Negative for cough.   Cardiovascular: Positive for near-syncope. Negative for syncope.  Gastrointestinal: Positive for nausea. Negative for vomiting, abdominal pain and diarrhea.  Musculoskeletal: Negative for myalgias, back pain, neck pain and neck stiffness.  Neurological: Positive for dizziness and headaches. Negative for focal weakness, seizures, weakness, numbness, paresthesias and loss of balance.  All other systems reviewed and are negative.     Allergies  Review of patient's allergies indicates no known allergies.  Home Medications   Prior to Admission medications   Medication Sig Start Date End Date Taking? Authorizing Provider  albuterol (PROVENTIL HFA;VENTOLIN HFA) 108 (90 BASE) MCG/ACT inhaler Inhale 2 puffs into the lungs every 6 (six) hours as needed for wheezing or shortness of breath.   Yes Historical Provider, MD  aspirin EC 325 MG tablet Take 650 mg by mouth daily as needed for mild pain.   Yes Historical Provider, MD  ranitidine (ZANTAC) 75 MG tablet Take 75 mg by mouth daily as needed for heartburn.   Yes Historical Provider, MD  cephALEXin (KEFLEX) 500 MG capsule Take 1 capsule (500 mg total) by mouth 4 (four) times daily. 08/04/15   Danelle BerryLeisa Deward Sebek, PA-C   BP 122/71 mmHg  Pulse 75  Temp(Src) 98.8 F (37.1 C) (Oral)  Resp 18  Ht 5\' 4"  (1.626 m)  Wt 81.194 kg  BMI 30.71 kg/m2  SpO2 100%  LMP  Physical Exam  Constitutional: She is oriented to person, place, and time. She appears well-developed and well-nourished. No distress.  HENT:  Head: Normocephalic and atraumatic.  Nose: Nose normal.  Mouth/Throat: Uvula is midline and oropharynx is clear and moist. Mucous membranes are not pale, not dry and not cyanotic. No oropharyngeal exudate.  Eyes: Conjunctivae, EOM and lids are  normal. Pupils are equal, round, and reactive to light. Right eye exhibits no discharge. Left eye exhibits no discharge. No scleral icterus.  Neck: Normal range of motion. No JVD present. No tracheal deviation present. No thyromegaly present.  Cardiovascular: Normal rate, regular rhythm, normal heart sounds and intact distal pulses.  Exam reveals no gallop and no friction rub.   No murmur heard. Pulmonary/Chest: Effort normal and breath sounds normal. No respiratory distress. She has no wheezes. She has no rales. She exhibits no tenderness.  Abdominal: Soft. Bowel sounds are normal. She exhibits no distension and no mass. There is no tenderness. There is no rebound and no guarding.  Musculoskeletal: Normal range  of motion. She exhibits no edema or tenderness.  Lymphadenopathy:    She has no cervical adenopathy.  Neurological: She is alert and oriented to person, place, and time. She has normal strength and normal reflexes. No cranial nerve deficit or sensory deficit. She exhibits normal muscle tone. Coordination normal.  Speech is clear and goal oriented, follows commands Major Cranial nerves without deficit, no facial droop Normal strength in upper and lower extremities bilaterally including dorsiflexion and plantar flexion, strong and equal grip strength Sensation normal to light and sharp touch Moves extremities without ataxia, coordination intact Normal finger to nose and rapid alternating movements Neg romberg, no pronator drift Normal gait and balance   Skin: Skin is warm and dry. No rash noted. She is not diaphoretic. No erythema. No pallor.  Psychiatric: She has a normal mood and affect. Her behavior is normal. Judgment and thought content normal.  Nursing note and vitals reviewed.   ED Course  Procedures (including critical care time) Labs Review Labs Reviewed  URINALYSIS, ROUTINE W REFLEX MICROSCOPIC (NOT AT [facility]) - Abnormal; Notable for the following:    Leukocytes, UA SMALL  (*)    All other components within normal limits  COMPREHENSIVE METABOLIC PANEL - Abnormal; Notable for the following:    Calcium 8.6 (*)    All other components within normal limits  CBC WITH DIFFERENTIAL/PLATELET - Abnormal; Notable for the following:    Hemoglobin 11.5 (*)    HCT 35.5 (*)    All other components within normal limits  URINE MICROSCOPIC-ADD ON - Abnormal; Notable for the following:    Squamous Epithelial / LPF 0-5 (*)    Bacteria, UA FEW (*)    All other components within normal limits  URINE CULTURE  PREGNANCY, URINE  CBG MONITORING, ED  CBG MONITORING, ED    Imaging Review Ct Head Wo Contrast  08/04/2015  CLINICAL DATA:  Headache for 4 days, hot flashes, and dizziness with standing. History of anxiety. EXAM: CT HEAD WITHOUT CONTRAST TECHNIQUE: Contiguous axial images were obtained from the base of the skull through the vertex without intravenous contrast. COMPARISON:  CT HEAD March 18, 2008 FINDINGS: INTRACRANIAL CONTENTS: The ventricles and sulci are normal. No intraparenchymal hemorrhage, mass effect nor midline shift. No acute large vascular territory infarcts. No abnormal extra-axial fluid collections. Basal cisterns are patent. ORBITS: The included ocular globes and orbital contents are normal. SINUSES: The mastoid aircells and included paranasal sinuses are well-aerated. SKULL/SOFT TISSUES: No skull fracture. No significant soft tissue swelling. IMPRESSION: Normal CT HEAD. Electronically Signed   By: Awilda Metro M.D.   On: 08/04/2015 00:31   I have personally reviewed and evaluated these images and lab results as part of my medical decision-making.   EKG Interpretation None      MDM   Pt with gradual onset headache x 4 days with new onset of sleepiness today, dizziness Also complains of urinary frequency, with dark urine, increased thirst  Patient was given a headache cocktail - decadron, reglan and benadryl, basic labs and urinalysis were  obtained.  Imaging choice reviewed with attending physician and with radiologist Dr. Clovis Riley, who agree with head CT initially, due to the atypical headache presentation and strong family history of aneurysms.  Patient did not have any neurological deficit on exam, is generally well-appearing, no concern for meningitis, history not suggestive of SAH.   Patient's lab work was unremarkable, mild anemia, urinalysis with small leukocytes and on microscopy few bacteria and 6-30 WBC.   Head  CT was negative . The results were reviewed with the patient he reported on reexamination and she felt much better, she is able to ambulate around the room without any worsening headache, no dizziness or lightheadedness. She is able to eat and drink without difficulty in the ER. She'll be discharged home with outpatient neurology follow-up.  Dr.  Cyndie Chime requests urine culture results guide Abx, pt provided keflex Rx for + cultures.  Out neuro follow up ordered.  Pt is in agreement with plan, feels much better.  Return precautions reviewed.  Pt discharged in good condition.  Final diagnoses:  Nonintractable headache, unspecified chronicity pattern, unspecified headache type  Urinary frequency     Danelle Berry, PA-C 08/04/15 0222  Leta Baptist, MD 08/04/15 [phone]

## 2015-08-04 MED ORDER — CEPHALEXIN 500 MG PO CAPS: 500.0000 mg | ORAL_CAPSULE | Freq: Four times a day (QID) | ORAL | Status: DC

## 2015-08-04 NOTE — ED Notes (Signed)
Pt able to ambulate in room without assistance---- gait and balance steady.  Pt denies dizziness while ambulating.

## 2015-08-04 NOTE — ED Notes (Signed)
Pt was given ice water and sprite for po challenge.

## 2015-08-04 NOTE — Discharge Instructions (Signed)
Please take tylenol or ibuprofen to treat headache.  You have been ordered outpatient neurology follow up, someone will contact you. A urine culture is pending, you have been provided a prescription of keflex if it is positive, you can check results on My Chart.    General Headache Without Cause A headache is pain or discomfort felt around the head or neck area. The specific cause of a headache may not be found. There are many causes and types of headaches. A few common ones are:  Tension headaches.  Migraine headaches.  Cluster headaches.  Chronic daily headaches. HOME CARE INSTRUCTIONS  Watch your condition for any changes. Take these steps to help with your condition: Managing Pain  Take over-the-counter and prescription medicines only as told by your health care provider.  Lie down in a dark, quiet room when you have a headache.  If directed, apply ice to the head and neck area:  Put ice in a plastic bag.  Place a towel between your skin and the bag.  Leave the ice on for 20 minutes, 2-3 times per day.  Use a heating pad or hot shower to apply heat to the head and neck area as told by your health care provider.  Keep lights dim if bright lights bother you or make your headaches worse. Eating and Drinking  Eat meals on a regular schedule.  Limit alcohol use.  Decrease the amount of caffeine you drink, or stop drinking caffeine. General Instructions  Keep all follow-up visits as told by your health care provider. This is important.  Keep a headache journal to help find out what may trigger your headaches. For example, write down:  What you eat and drink.  How much sleep you get.  Any change to your diet or medicines.  Try massage or other relaxation techniques.  Limit stress.  Sit up straight, and do not tense your muscles.  Do not use tobacco products, including cigarettes, chewing tobacco, or e-cigarettes. If you need help quitting, ask your health care  provider.  Exercise regularly as told by your health care provider.  Sleep on a regular schedule. Get 7-9 hours of sleep, or the amount recommended by your health care provider. SEEK MEDICAL CARE IF:   Your symptoms are not helped by medicine.  You have a headache that is different from the usual headache.  You have nausea or you vomit.  You have a fever. SEEK IMMEDIATE MEDICAL CARE IF:   Your headache becomes severe.  You have repeated vomiting.  You have a stiff neck.  You have a loss of vision.  You have problems with speech.  You have pain in the eye or ear.  You have muscular weakness or loss of muscle control.  You lose your balance or have trouble walking.  You feel faint or pass out.  You have confusion.   This information is not intended to replace advice given to you by your health care provider. Make sure you discuss any questions you have with your health care provider.   Document Released: 01/25/2005 Document Revised: 10/16/2014 Document Reviewed: 05/20/2014 Elsevier Interactive Patient Education [employer]2016 [employer]  Urinary Frequency The number of times a normal person urinates depends upon how much liquid they take in and how much liquid they are losing. If the temperature is hot and there is high humidity, then the person will sweat more and usually breathe a little more frequently. These factors decrease the amount of frequency of urination that would  be considered normal. The amount you drink is easily determined, but the amount of fluid lost is sometimes more difficult to calculate.  Fluid is lost in two ways:  Sensible fluid loss is usually measured by the amount of urine that you get rid of. Losses of fluid can also occur with diarrhea.  Insensible fluid loss is more difficult to measure. It is caused by evaporation. Insensible loss of fluid occurs through breathing and sweating. It usually ranges from a little less than a quart to a little more  than a quart of fluid a day. In normal temperatures and activity levels, the average person may urinate 4 to 7 times in a 24-hour period. Needing to urinate more often than that could indicate a problem. If one urinates 4 to 7 times in 24 hours and has large volumes each time, that could indicate a different problem from one who urinates 4 to 7 times a day and has small volumes. The time of urinating is also important. Most urinating should be done during the waking hours. Getting up at night to urinate frequently can indicate some problems. CAUSES  The bladder is the organ in your lower abdomen that holds urine. Like a balloon, it swells some as it fills up. Your nerves sense this and tell you it is time to head for the bathroom. There are a number of reasons that you might feel the need to urinate more often than usual. They include:  Urinary tract infection. This is usually associated with other signs such as burning when you urinate.  In men, problems with the prostate (a walnut-size gland that is located near the tube that carries urine out of your body). There are two reasons why the prostate can cause an increased frequency of urination:  An enlarged prostate that does not let the bladder empty well. If the bladder only half empties when you urinate, then it only has half the capacity to fill before you have to urinate again.  The nerves in the bladder become more hypersensitive with an increased size of the prostate even if the bladder empties completely.  Pregnancy.  Obesity. Excess weight is more likely to cause a problem for women than for men.  Bladder stones or other bladder problems.  Caffeine.  Alcohol.  Medications. For example, drugs that help the body get rid of extra fluid (diuretics) increase urine production. Some other medicines must be taken with lots of fluids.  Muscle or nerve weakness. This might be the result of a spinal cord injury, a stroke, multiple sclerosis,  or Parkinson disease.  Long-standing diabetes can decrease the sensation of the bladder. This loss of sensation makes it harder to sense the bladder needs to be emptied. Over a period of years, the bladder is stretched out by constant overfilling. This weakens the bladder muscles so that the bladder does not empty well and has less capacity to fill with new urine.  Interstitial cystitis (also called painful bladder syndrome). This condition develops because the tissues that line the inside of the bladder are inflamed (inflammation is the body's way of reacting to injury or infection). It causes pain and frequent urination. It occurs in women more often than in men. DIAGNOSIS   To decide what might be causing your urinary frequency, your health care provider will probably:  Ask about symptoms you have noticed.  Ask about your overall health. This will include questions about any medications you are taking.  Do a physical examination.  Order  some tests. These might include:  A blood test to check for diabetes or other health issues that could be contributing to the problem.  Urine testing. This could measure the flow of urine and the pressure on the bladder.  A test of your neurological system (the brain, spinal cord, and nerves). This is the system that senses the need to urinate.  A bladder test to check whether it is emptying completely when you urinate.  Cystoscopy. This test uses a thin tube with a tiny camera on it. It offers a look inside your urethra and bladder to see if there are problems.  Imaging tests. You might be given a contrast dye and then asked to urinate. X-rays are taken to see how your bladder is working. TREATMENT  It is important for you to be evaluated to determine if the amount or frequency that you have is unusual or abnormal. If it is found to be abnormal, the cause should be determined and this can usually be found out easily. Depending upon the cause,  treatment could include medication, stimulation of the nerves, or surgery. There are not too many things that you can do as an individual to change your urinary frequency. It is important that you balance the amount of fluid intake needed to compensate for your activity and the temperature. Medical problems will be diagnosed and taken care of by your physician. There is no particular bladder training such as Kegel exercises that you can do to help urinary frequency. This is an exercise that is usually recommended for people who have leaking of urine when they laugh, cough, or sneeze. HOME CARE INSTRUCTIONS   Take any medications your health care provider prescribed or suggested. Follow the directions carefully.  Practice any lifestyle changes that are recommended. These might include:  Drinking less fluid or drinking at different times of the day. If you need to urinate often during the night, for example, you may need to stop drinking fluids early in the evening.  Cutting down on caffeine or alcohol. They both can make you need to urinate more often than normal. Caffeine is found in coffee, tea, and sodas.  Losing weight, if that is recommended.  Keep a journal or a log. You might be asked to record how much you drink and when and where you feel the need to urinate. This will also help evaluate how well the treatment provided by your physician is working. SEEK MEDICAL CARE IF:   Your need to urinate often gets worse.  You feel increased pain or irritation when you urinate.  You notice blood in your urine.  You have questions about any medications that your health care provider recommended.  You notice blood, pus, or swelling at the site of any test or treatment procedure.  You develop a fever of more than 100.37F (38.1C). SEEK IMMEDIATE MEDICAL CARE IF:  You develop a fever of more than 102.38F (38.9C).   This information is not intended to replace advice given to you by your health  care provider. Make sure you discuss any questions you have with your health care provider.   Document Released: 11/21/2008 Document Revised: 02/15/2014 Document Reviewed: 11/21/2008 Elsevier Interactive Patient Education [employer]2016 [employer]

## 2015-08-05 LAB — URINE CULTURE: SPECIAL REQUESTS: NORMAL

## 2017-10-04 ENCOUNTER — Ambulatory Visit (INDEPENDENT_AMBULATORY_CARE_PROVIDER_SITE_OTHER): Payer: Medicaid Other | Admitting: Obstetrics

## 2017-10-04 ENCOUNTER — Other Ambulatory Visit (HOSPITAL_COMMUNITY)
Admission: RE | Admit: 2017-10-04 | Discharge: 2017-10-04 | Disposition: A | Discharge status: Home / Self Care | Payer: Medicaid Other | Source: Ambulatory Visit | Attending: Obstetrics | Admitting: Obstetrics

## 2017-10-04 ENCOUNTER — Encounter: Payer: Self-pay | Admitting: Obstetrics

## 2017-10-04 VITALS — BP 143/88 | HR 71 | Ht 64.0 in | Wt 173.0 lb

## 2017-10-04 DIAGNOSIS — F4310 Post-traumatic stress disorder, unspecified: Secondary | ICD-10-CM | POA: Diagnosis not present

## 2017-10-04 DIAGNOSIS — B9689 Other specified bacterial agents as the cause of diseases classified elsewhere: Secondary | ICD-10-CM | POA: Insufficient documentation

## 2017-10-04 DIAGNOSIS — F329 Major depressive disorder, single episode, unspecified: Secondary | ICD-10-CM | POA: Diagnosis not present

## 2017-10-04 DIAGNOSIS — Z0000 Encounter for general adult medical examination without abnormal findings: Secondary | ICD-10-CM | POA: Diagnosis not present

## 2017-10-04 DIAGNOSIS — N760 Acute vaginitis: Secondary | ICD-10-CM

## 2017-10-04 DIAGNOSIS — Z01419 Encounter for gynecological examination (general) (routine) without abnormal findings: Secondary | ICD-10-CM | POA: Insufficient documentation

## 2017-10-04 DIAGNOSIS — N898 Other specified noninflammatory disorders of vagina: Secondary | ICD-10-CM

## 2017-10-04 DIAGNOSIS — F419 Anxiety disorder, unspecified: Secondary | ICD-10-CM | POA: Insufficient documentation

## 2017-10-04 DIAGNOSIS — D649 Anemia, unspecified: Secondary | ICD-10-CM | POA: Diagnosis not present

## 2017-10-04 MED ORDER — METRONIDAZOLE 500 MG PO TABS: 500.0000 mg | ORAL_TABLET | Freq: Two times a day (BID) | ORAL | 2 refills | Status: DC

## 2017-10-04 NOTE — Progress Notes (Signed)
Subjective:        [name] is a 32 y.o. female here for a routine exam.  Current complaints: Malodorous vaginal discharge.    Personal health questionnaire:  Is patient [name], have a family history of breast and/or ovarian cancer: no Is there a family history of uterine cancer diagnosed at age < 62, gastrointestinal cancer, urinary tract cancer, family member who is a Personnel officer syndrome-associated carrier: no Is the patient overweight and hypertensive, family history of diabetes, personal history of gestational diabetes, preeclampsia or PCOS: no Is patient over 14, have PCOS,  family history of premature CHD under age 21, diabetes, smoke, have hypertension or peripheral artery disease:  no At any time, has a partner hit, kicked or otherwise hurt or frightened you?: no Over the past 2 weeks, have you felt down, depressed or hopeless?: no Over the past 2 weeks, have you felt little interest or pleasure in doing things?:no   Gynecologic History No LMP recorded. (Menstrual status: IUD). Contraception: IUD Last Pap: unknown. Results were: unknown Last mammogram: n/a. Results were: n/a  Obstetric History OB History  Gravida Para Term Preterm AB Living  5 2 2   3 2   SAB TAB Ectopic Multiple Live Births    3   0 2    # Outcome Date GA Lbr Len/2nd Weight Sex Delivery Anes PTL Lv  5 Term 01/10/15 [redacted]w[redacted]d 04:43 / 00:07 8 lb 2.2 oz (3.691 kg) M Vag-Spont EPI  LIV  4 TAB 2015 [redacted]w[redacted]d         3 TAB 2013 [redacted]w[redacted]d         2 Term 2010 [redacted]w[redacted]d  7 lb 9 oz (3.43 kg) F    LIV  1 TAB 2008            Past Medical History:  Diagnosis Date  . Anemia   . Anxiety   . Asthma   . Chlamydia   . Depression    suicide att in 2007  . Hx of varicella   . Normal pregnancy in multigravida in third trimester 01/09/2015  . PTSD (post-traumatic stress disorder)     Past Surgical History:  Procedure Laterality Date  . DILATION AND CURETTAGE OF UTERUS    . TONSILLECTOMY    . WISDOM TOOTH  EXTRACTION       Current Outpatient Medications:  .  albuterol (PROVENTIL HFA;VENTOLIN HFA) 108 (90 BASE) MCG/ACT inhaler, Inhale 2 puffs into the lungs every 6 (six) hours as needed for wheezing or shortness of breath., Disp: , Rfl:  .  aspirin EC 325 MG tablet, Take 650 mg by mouth daily as needed for mild pain., Disp: , Rfl:  .  metroNIDAZOLE (FLAGYL) 500 MG tablet, Take 1 tablet (500 mg total) by mouth 2 (two) times daily., Disp: 14 tablet, Rfl: 2 .  ranitidine (ZANTAC) 75 MG tablet, Take 75 mg by mouth daily as needed for heartburn., Disp: , Rfl:  No Known Allergies  Social History   Tobacco Use  . Smoking status: Never Smoker  . Smokeless tobacco: Never Used  Substance Use Topics  . Alcohol use: Yes    Comment: occ    Family History  Problem Relation Age of Onset  . Hypertension Mother   . Depression Maternal Aunt   . Hypertension Maternal Aunt   . Stroke Maternal Aunt   . Hypertension Maternal Grandmother   . Alcohol abuse Neg Hx   . Arthritis Neg Hx   . Asthma Neg  Hx   . Birth defects Neg Hx   . Cancer Neg Hx   . COPD Neg Hx   . Diabetes Neg Hx   . Drug abuse Neg Hx   . Early death Neg Hx   . Hearing loss Neg Hx   . Heart disease Neg Hx   . Hyperlipidemia Neg Hx   . Kidney disease Neg Hx   . Learning disabilities Neg Hx   . Mental illness Neg Hx   . Mental retardation Neg Hx   . Miscarriages / Stillbirths Neg Hx   . Vision loss Neg Hx   . Varicose Veins Neg Hx       Review of Systems  Constitutional: negative for fatigue and weight loss Respiratory: negative for cough and wheezing Cardiovascular: negative for chest pain, fatigue and palpitations Gastrointestinal: negative for abdominal pain and change in bowel habits Musculoskeletal:negative for myalgias Neurological: negative for gait problems and tremors Behavioral/Psych: negative for abusive relationship, depression Endocrine: negative for temperature intolerance    Genitourinary:POSITIVE for  malodorous vaginal discharge Integument/breast: negative for breast lump, breast tenderness, nipple discharge and skin lesion(s)    Objective:       BP (!) 143/88   Pulse 71   Ht 5\' 4"  (1.626 m)   Wt 173 lb (78.5 kg)   BMI 29.70 kg/m  General:   alert  Skin:   no rash or abnormalities  Lungs:   clear to auscultation bilaterally  Heart:   regular rate and rhythm, S1, S2 normal, no murmur, click, rub or gallop  Breasts:   normal without suspicious masses, skin or nipple changes or axillary nodes  Abdomen:  normal findings: no organomegaly, soft, non-tender and no hernia  Pelvis:  External genitalia: normal general appearance Urinary system: urethral meatus normal and bladder without fullness, nontender Vaginal: normal without tenderness, induration or masses Cervix: normal appearance Adnexa: normal bimanual exam Uterus: anteverted and non-tender, normal size   Lab Review Urine pregnancy test Labs reviewed yes Radiologic studies reviewed no  50% of 20 min visit spent on counseling and coordination of care.   Assessment:     1. Encounter for routine gynecological examination with Papanicolaou smear of cervix Rx: - Cytology - PAP  2. BV (bacterial vaginosis) Rx: - metroNIDAZOLE (FLAGYL) 500 MG tablet; Take 1 tablet (500 mg total) by mouth 2 (two) times daily.  Dispense: 14 tablet; Refill: 2  3. Vaginal discharge Rx: - Cervicovaginal ancillary only    Plan:    Education reviewed: calcium supplements, depression evaluation, low fat, low cholesterol diet, safe sex/STD prevention, self breast exams and weight bearing exercise. Contraception: IUD. Follow up in: 1 year.   Meds ordered this encounter  Medications  . metroNIDAZOLE (FLAGYL) 500 MG tablet    Sig: Take 1 tablet (500 mg total) by mouth 2 (two) times daily.    Dispense:  14 tablet    Refill:  2   No orders of the defined types were placed in this encounter.   Brock BadHARLES A. Shandon Burlingame MD 10-04-2017

## 2017-10-04 NOTE — Patient Instructions (Signed)
Kegel Exercises Kegel exercises help strengthen the muscles that support the rectum, vagina, small intestine, bladder, and uterus. Doing Kegel exercises can help:  Improve bladder and bowel control.  Improve sexual response.  Reduce problems and discomfort during pregnancy.  Kegel exercises involve squeezing your pelvic floor muscles, which are the same muscles you squeeze when you try to stop the flow of urine. The exercises can be done while sitting, standing, or lying down, but it is best to vary your position. Phase 1 exercises 1. Squeeze your pelvic floor muscles tight. You should feel a tight lift in your rectal area. If you are a female, you should also feel a tightness in your vaginal area. Keep your stomach, buttocks, and legs relaxed. 2. Hold the muscles tight for up to 10 seconds. 3. Relax your muscles. Repeat this exercise 50 times a day or as many times as told by your health care provider. Continue to do this exercise for at least 4-6 weeks or for as long as told by your health care provider. This information is not intended to replace advice given to you by your health care provider. Make sure you discuss any questions you have with your health care provider. Document Released: 01/12/2012 Document Revised: 09/20/2015 Document Reviewed: 12/15/2014 Elsevier Interactive Patient Education  2018 [employer]  Bacterial Vaginosis Bacterial vaginosis is a vaginal infection that occurs when the normal balance of bacteria in the vagina is disrupted. It results from an overgrowth of certain bacteria. This is the most common vaginal infection among women ages 4615-44. Because bacterial vaginosis increases your risk for STIs (sexually transmitted infections), getting treated can help reduce your risk for chlamydia, gonorrhea, herpes, and HIV (human immunodeficiency virus). Treatment is also important for preventing complications in pregnant women, because this condition can cause an early  (premature) delivery. What are the causes? This condition is caused by an increase in harmful bacteria that are normally present in small amounts in the vagina. However, the reason that the condition develops is not fully understood. What increases the risk? The following factors may make you more likely to develop this condition:  Having a new sexual partner or multiple sexual partners.  Having unprotected sex.  Douching.  Having an intrauterine device (IUD).  Smoking.  Drug and alcohol abuse.  Taking certain antibiotic medicines.  Being pregnant.  You cannot get bacterial vaginosis from toilet seats, bedding, swimming pools, or contact with objects around you. What are the signs or symptoms? Symptoms of this condition include:  Grey or white vaginal discharge. The discharge can also be watery or foamy.  A fish-like odor with discharge, especially after sexual intercourse or during menstruation.  Itching in and around the vagina.  Burning or pain with urination.  Some women with bacterial vaginosis have no signs or symptoms. How is this diagnosed? This condition is diagnosed based on:  Your medical history.  A physical exam of the vagina.  Testing a sample of vaginal fluid under a microscope to look for a large amount of bad bacteria or abnormal cells. Your health care provider may use a cotton swab or a small wooden spatula to collect the sample.  How is this treated? This condition is treated with antibiotics. These may be given as a pill, a vaginal cream, or a medicine that is put into the vagina (suppository). If the condition comes back after treatment, a second round of antibiotics may be needed. Follow these instructions at home: Medicines  Take over-the-counter and prescription medicines  only as told by your health care provider.  Take or use your antibiotic as told by your health care provider. Do not stop taking or using the antibiotic even if you start  to feel better. General instructions  If you have a female sexual partner, tell her that you have a vaginal infection. She should see her health care provider and be treated if she has symptoms. If you have a female sexual partner, he does not need treatment.  During treatment: ? Avoid sexual activity until you finish treatment. ? Do not douche. ? Avoid alcohol as directed by your health care provider. ? Avoid breastfeeding as directed by your health care provider.  Drink enough water and fluids to keep your urine clear or pale yellow.  Keep the area around your vagina and rectum clean. ? Wash the area daily with warm water. ? Wipe yourself from front to back after using the toilet.  Keep all follow-up visits as told by your health care provider. This is important. How is this prevented?  Do not douche.  Wash the outside of your vagina with warm water only.  Use protection when having sex. This includes latex condoms and dental dams.  Limit how many sexual partners you have. To help prevent bacterial vaginosis, it is best to have sex with just one partner (monogamous).  Make sure you and your sexual partner are tested for STIs.  Wear cotton or cotton-lined underwear.  Avoid wearing tight pants and pantyhose, especially during summer.  Limit the amount of alcohol that you drink.  Do not use any products that contain nicotine or tobacco, such as cigarettes and e-cigarettes. If you need help quitting, ask your health care provider.  Do not use illegal drugs. Where to find more information:  Centers for Disease Control and Prevention: SolutionApps.co.za  American Sexual Health Association (ASHA): www.ashastd.org  [place] Department of Health and Health and safety inspector, Office on Women's Health: ConventionalMedicines.si or http://www.anderson-williamson.info/ Contact a health care provider if:  Your symptoms do not improve, even after treatment.  You have more  discharge or pain when urinating.  You have a fever.  You have pain in your abdomen.  You have pain during sex.  You have vaginal bleeding between periods. Summary  Bacterial vaginosis is a vaginal infection that occurs when the normal balance of bacteria in the vagina is disrupted.  Because bacterial vaginosis increases your risk for STIs (sexually transmitted infections), getting treated can help reduce your risk for chlamydia, gonorrhea, herpes, and HIV (human immunodeficiency virus). Treatment is also important for preventing complications in pregnant women, because the condition can cause an early (premature) delivery.  This condition is treated with antibiotic medicines. These may be given as a pill, a vaginal cream, or a medicine that is put into the vagina (suppository). This information is not intended to replace advice given to you by your health care provider. Make sure you discuss any questions you have with your health care provider. Document Released: 01/25/2005 Document Revised: 05/31/2016 Document Reviewed: 10/11/2015 Elsevier Interactive Patient Education  2018 [employer]. Urinary Incontinence Urinary incontinence is the involuntary loss of urine from your bladder. What are the causes? There are many causes of urinary incontinence. They include:  Medicines.  Infections.  Prostatic enlargement, leading to overflow of urine from your bladder.  Surgery.  Neurological diseases.  Emotional factors.  What are the signs or symptoms? Urinary Incontinence can be divided into four types: 4. Urge incontinence. Urge incontinence is the involuntary loss of  urine before you have the opportunity to go to the bathroom. There is a sudden urge to void but not enough time to reach a bathroom. 5. Stress incontinence. Stress incontinence is the sudden loss of urine with any activity that forces urine to pass. It is commonly caused by anatomical changes to the pelvis and  sphincter areas of your body. 6. Overflow incontinence. Overflow incontinence is the loss of urine from an obstructed opening to your bladder. This results in a backup of urine and a resultant buildup of pressure within the bladder. When the pressure within the bladder exceeds the closing pressure of the sphincter, the urine overflows, which causes incontinence, similar to water overflowing a dam. 7. Total incontinence. Total incontinence is the loss of urine as a result of the inability to store urine within your bladder.  How is this diagnosed? Evaluating the cause of incontinence may require:  A thorough and complete medical and obstetric history.  A complete physical exam.  Laboratory tests such as a urine culture and sensitivities.  When additional tests are indicated, they can include:  An ultrasound exam.  Kidney and bladder X-rays.  Cystoscopy. This is an exam of the bladder using a narrow scope.  Urodynamic testing to test the nerve function to the bladder and sphincter areas.  How is this treated? Treatment for urinary incontinence depends on the cause:  For urge incontinence caused by a bacterial infection, antibiotics will be prescribed. If the urge incontinence is related to medicines you take, your health care provider may have you change the medicine.  For stress incontinence, surgery to re-establish anatomical support to the bladder or sphincter, or both, will often correct the condition.  For overflow incontinence caused by an enlarged prostate, an operation to open the channel through the enlarged prostate will allow the flow of urine out of the bladder. In women with fibroids, a hysterectomy may be recommended.  For total incontinence, surgery on your urinary sphincter may help. An artificial urinary sphincter (an inflatable cuff placed around the urethra) may be required. In women who have developed a hole-like passage between their bladder and vagina  (vesicovaginal fistula), surgery to close the fistula often is required.  Follow these instructions at home:  Normal daily hygiene and the use of pads or adult diapers that are changed regularly will help prevent odors and skin damage.  Avoid caffeine. It can overstimulate your bladder.  Use the bathroom regularly. Try about every 2-3 hours to go to the bathroom, even if you do not feel the need to do so. Take time to empty your bladder completely. After urinating, wait a minute. Then try to urinate again.  For causes involving nerve dysfunction, keep a log of the medicines you take and a journal of the times you go to the bathroom. Contact a health care provider if:  You experience worsening of pain instead of improvement in pain after your procedure.  Your incontinence becomes worse instead of better. Get help right away if:  You experience fever or shaking chills.  You are unable to pass your urine.  You have redness spreading into your groin or down into your thighs. This information is not intended to replace advice given to you by your health care provider. Make sure you discuss any questions you have with your health care provider. Document Released: 03/04/2004 Document Revised: 09/05/2015 Document Reviewed: 07/04/2012 Elsevier Interactive Patient Education  [employer].

## 2017-10-05 LAB — CERVICOVAGINAL ANCILLARY ONLY
CHLAMYDIA, DNA PROBE: NEGATIVE
NEISSERIA GONORRHEA: NEGATIVE

## 2017-10-06 LAB — CYTOLOGY - PAP
Diagnosis: NEGATIVE
HPV: NOT DETECTED

## 2018-05-03 ENCOUNTER — Encounter (HOSPITAL_COMMUNITY): Payer: Self-pay | Admitting: Emergency Medicine

## 2018-05-03 ENCOUNTER — Other Ambulatory Visit: Payer: Self-pay

## 2018-05-03 ENCOUNTER — Emergency Department (HOSPITAL_COMMUNITY)
Admission: EM | Admit: 2018-05-03 | Discharge: 2018-05-03 | Disposition: A | Discharge status: Home / Self Care | Payer: Medicaid Other | Attending: Emergency Medicine | Admitting: Emergency Medicine

## 2018-05-03 ENCOUNTER — Emergency Department (HOSPITAL_COMMUNITY): Payer: Medicaid Other

## 2018-05-03 DIAGNOSIS — J45909 Unspecified asthma, uncomplicated: Secondary | ICD-10-CM | POA: Diagnosis not present

## 2018-05-03 DIAGNOSIS — R519 Headache, unspecified: Secondary | ICD-10-CM

## 2018-05-03 DIAGNOSIS — R110 Nausea: Secondary | ICD-10-CM | POA: Insufficient documentation

## 2018-05-03 DIAGNOSIS — H53149 Visual discomfort, unspecified: Secondary | ICD-10-CM | POA: Diagnosis not present

## 2018-05-03 DIAGNOSIS — R030 Elevated blood-pressure reading, without diagnosis of hypertension: Secondary | ICD-10-CM | POA: Diagnosis not present

## 2018-05-03 DIAGNOSIS — R51 Headache: Secondary | ICD-10-CM | POA: Insufficient documentation

## 2018-05-03 LAB — CBC WITH DIFFERENTIAL/PLATELET
Abs Immature Granulocytes: 0.04 10*3/uL (ref 0.00–0.07)
Basophils Absolute: 0 10*3/uL (ref 0.0–0.1)
Basophils Relative: 0 %
EOS ABS: 0.2 10*3/uL (ref 0.0–0.5)
EOS PCT: 2 %
HEMATOCRIT: 40.3 % (ref 36.0–46.0)
Hemoglobin: 12.5 g/dL (ref 12.0–15.0)
Immature Granulocytes: 0 %
LYMPHS ABS: 3 10*3/uL (ref 0.7–4.0)
LYMPHS PCT: 28 %
MCH: 29.9 pg (ref 26.0–34.0)
MCHC: 31 g/dL (ref 30.0–36.0)
MCV: 96.4 fL (ref 80.0–100.0)
MONO ABS: 0.8 10*3/uL (ref 0.1–1.0)
Monocytes Relative: 8 %
NEUTROS ABS: 6.9 10*3/uL (ref 1.7–7.7)
Neutrophils Relative %: 62 %
Platelets: 358 10*3/uL (ref 150–400)
RBC: 4.18 MIL/uL (ref 3.87–5.11)
RDW: 13.2 % (ref 11.5–15.5)
WBC: 11 10*3/uL — ABNORMAL HIGH (ref 4.0–10.5)
nRBC: 0 % (ref 0.0–0.2)

## 2018-05-03 LAB — COMPREHENSIVE METABOLIC PANEL
ALT: 12 U/L (ref 0–44)
ANION GAP: 9 (ref 5–15)
AST: 18 U/L (ref 15–41)
Albumin: 4.1 g/dL (ref 3.5–5.0)
Alkaline Phosphatase: 53 U/L (ref 38–126)
BILIRUBIN TOTAL: 0.6 mg/dL (ref 0.3–1.2)
BUN: 15 mg/dL (ref 6–20)
CALCIUM: 9.1 mg/dL (ref 8.9–10.3)
CO2: 25 mmol/L (ref 22–32)
Chloride: 104 mmol/L (ref 98–111)
Creatinine, Ser: 0.98 mg/dL (ref 0.44–1.00)
GFR calc Af Amer: >60 mL/min (ref >60)
GLUCOSE: 106 mg/dL — AB (ref 70–99)
POTASSIUM: 3.6 mmol/L (ref 3.5–5.1)
Sodium: 138 mmol/L (ref 135–145)
Total Protein: 7.4 g/dL (ref 6.5–8.1)

## 2018-05-03 LAB — I-STAT BETA HCG BLOOD, ED (MC, WL, AP ONLY): I-stat hCG, quantitative: <5 m[IU]/mL (ref <5)

## 2018-05-03 MED ORDER — ACETAMINOPHEN 500 MG PO TABS
1000.0000 mg | ORAL_TABLET | Freq: Once | ORAL | Status: AC
  Administered 2018-05-03: 1000 mg via ORAL
  Filled 2018-05-03: qty 2

## 2018-05-03 MED ORDER — DIPHENHYDRAMINE HCL 50 MG/ML IJ SOLN
25.0000 mg | Freq: Once | INTRAMUSCULAR | Status: AC
  Administered 2018-05-03: 25 mg via INTRAVENOUS
  Filled 2018-05-03: qty 1

## 2018-05-03 MED ORDER — PROCHLORPERAZINE EDISYLATE 10 MG/2ML IJ SOLN
10.0000 mg | Freq: Once | INTRAMUSCULAR | Status: AC
  Administered 2018-05-03: 10 mg via INTRAVENOUS
  Filled 2018-05-03: qty 2

## 2018-05-03 MED ORDER — SODIUM CHLORIDE 0.9 % IV BOLUS: 1000.0000 mL | Freq: Once | INTRAVENOUS | Status: DC

## 2018-05-03 NOTE — ED Notes (Signed)
Patient transported to CT 

## 2018-05-03 NOTE — ED Provider Notes (Signed)
[facility]   CSN: [id number] Arrival date & time: 05/03/18  [id number]    History   Chief Complaint Chief Complaint  Patient presents with   Headache    HPI [name] is a 33 y.o. female.     HPI  Patient is a 33 year old female with a history of anxiety, asthma, depression presenting for left-sided headache.  Patient reports that she has a history of left-sided headaches, however they are not usually this severe.  She reports she woke up at around 5 to 5:30 AM and felt that her right-sided supraorbital region was sharp and "throbbing".  She reports nausea but no vomiting.  She reports photophobia but no visual disturbance with blurred vision or diplopia.  No weakness or numbness in extremities.  No syncope.  Patient denies any fevers, chills, neck stiffness or rash.  No history of hypercoagulability and no recent pregnancies within 6 months.  Patient does report that she is a family history of brain aneurysm in both her mother and grandmother.  She has never personally been diagnosed with brain aneurysm.  Past Medical History:  Diagnosis Date   Anemia    Anxiety    Asthma    Chlamydia    Depression    suicide att in 2007   Hx of varicella    Normal pregnancy in multigravida in third trimester 01/09/2015   PTSD (post-traumatic stress disorder)     Patient Active Problem List   Diagnosis Date Noted   Normal labor and delivery 01/10/2015   SVD (spontaneous vaginal delivery) 01/10/2015   Normal pregnancy, repeat 01/10/2015   Normal pregnancy in multigravida in third trimester 01/09/2015    Past Surgical History:  Procedure Laterality Date   DILATION AND CURETTAGE OF UTERUS     TONSILLECTOMY     WISDOM TOOTH EXTRACTION       OB History    Gravida  5   Para  2   Term  2   Preterm      AB  3   Living  2     SAB      TAB  3   Ectopic      Multiple  0   Live Births  2             Home Medications    Prior to Admission medications   Medication Sig Start Date End Date Taking? Authorizing Provider  albuterol (PROVENTIL HFA;VENTOLIN HFA) 108 (90 BASE) MCG/ACT inhaler Inhale 2 puffs into the lungs every 6 (six) hours as needed for wheezing or shortness of breath.    [provider]  aspirin EC 325 MG tablet Take 650 mg by mouth daily as needed for mild pain.    [provider]  metroNIDAZOLE (FLAGYL) 500 MG tablet Take 1 tablet (500 mg total) by mouth 2 (two) times daily. 10/04/17   Brock BadHarper, Charles A, MD  ranitidine (ZANTAC) 75 MG tablet Take 75 mg by mouth daily as needed for heartburn.    [provider]    Family History Family History  Problem Relation Age of Onset   Hypertension Mother    Depression Maternal Aunt    Hypertension Maternal Aunt    Stroke Maternal Aunt    Hypertension Maternal Grandmother    Alcohol abuse Neg Hx    Arthritis Neg Hx    Asthma Neg Hx    Birth defects Neg Hx    Cancer Neg Hx  COPD Neg Hx    Diabetes Neg Hx    Drug abuse Neg Hx    Early death Neg Hx    Hearing loss Neg Hx    Heart disease Neg Hx    Hyperlipidemia Neg Hx    Kidney disease Neg Hx    Learning disabilities Neg Hx    Mental illness Neg Hx    Mental retardation Neg Hx    Miscarriages / Stillbirths Neg Hx    Vision loss Neg Hx    Varicose Veins Neg Hx     Social History Social History   Tobacco Use   Smoking status: Never Smoker   Smokeless tobacco: Never Used  Substance Use Topics   Alcohol use: Yes    Comment: occ   Drug use: No     Allergies   Patient has no known allergies.   Review of Systems Review of Systems  Constitutional: Negative for chills and fever.  HENT: Negative for congestion, rhinorrhea, sinus pain and sore throat.   Eyes: Positive for photophobia. Negative for visual disturbance.  Respiratory: Negative for cough, chest tightness and shortness of breath.    Cardiovascular: Negative for chest pain.  Gastrointestinal: Negative for abdominal pain, nausea and vomiting.  Musculoskeletal: Negative for back pain and myalgias.  Skin: Negative for rash.  Neurological: Positive for headaches. Negative for dizziness, syncope and light-headedness.  All other systems reviewed and are negative.    Physical Exam Updated Vital Signs BP (!) 180/136    Pulse 60    Temp 98.5 F (36.9 C) (Oral)    Wt 78.5 kg    SpO2 100%    BMI 29.71 kg/m   Physical Exam Vitals signs and nursing note reviewed.  Constitutional:      Appearance: She is well-developed.     Comments: Tearful and anxious appearing on initial exam. Fully alert and interactive.   HENT:     Head: Normocephalic and atraumatic.  Eyes:     Conjunctiva/sclera: Conjunctivae normal.     Pupils: Pupils are equal, round, and reactive to light.  Neck:     Musculoskeletal: Normal range of motion and neck supple.  Cardiovascular:     Rate and Rhythm: Normal rate and regular rhythm.     Heart sounds: S1 normal and S2 normal. No murmur.  Pulmonary:     Effort: Pulmonary effort is normal.     Breath sounds: Normal breath sounds. No wheezing or rales.  Abdominal:     General: There is no distension.     Palpations: Abdomen is soft.     Tenderness: There is no abdominal tenderness. There is no guarding.  Musculoskeletal: Normal range of motion.        General: No deformity.  Lymphadenopathy:     Cervical: No cervical adenopathy.  Skin:    General: Skin is warm and dry.     Findings: No erythema or rash.  Neurological:     Mental Status: She is alert.     GCS: GCS eye subscore is 4. GCS verbal subscore is 5. GCS motor subscore is 6.     Comments: Mental Status:  Alert, oriented, thought content appropriate, able to give a coherent history. Speech fluent without evidence of aphasia. Able to follow 2 step commands without difficulty.  Cranial Nerves:  II:  Peripheral visual fields grossly normal,  pupils equal, round, reactive to light III,IV, VI: ptosis not present, extra-ocular motions intact bilaterally  V,VII: smile symmetric, facial light touch sensation equal  VIII: hearing grossly normal to voice  X: uvula elevates symmetrically  XI: bilateral shoulder shrug symmetric and strong XII: midline tongue extension without fassiculations Motor:  Normal tone. 5/5 in upper and lower extremities bilaterally including strong and equal grip strength and dorsiflexion/plantar flexion Sensory: Pinprick and light touch normal in all extremities.  Deep Tendon Reflexes: 2+ and symmetric in the biceps and patella. No clonus. Cerebellar: normal finger-to-nose with bilateral upper extremities Gait: normal gait and balance Stance: No pronator drift and good coordination, strength, and position sense with tapping of bilateral arms (performed in sitting position). CV: distal pulses palpable throughout    Psychiatric:        Behavior: Behavior normal.        Thought Content: Thought content normal.        Judgment: Judgment normal.      ED Treatments / Results  Labs (all labs ordered are listed, but only abnormal results are displayed) Labs Reviewed  CBC WITH DIFFERENTIAL/PLATELET - Abnormal; Notable for the following components:      Result Value   WBC 11.0 (*)    All other components within normal limits  COMPREHENSIVE METABOLIC PANEL - Abnormal; Notable for the following components:   Glucose, Bld 106 (*)    All other components within normal limits  I-STAT BETA HCG BLOOD, ED ([facility], [facility], [facility] ONLY)    EKG None  Radiology Ct Head Wo Contrast  Result Date: 05/03/2018 CLINICAL DATA:  Severe headache EXAM: CT HEAD WITHOUT CONTRAST TECHNIQUE: Contiguous axial images were obtained from the base of the skull through the vertex without intravenous contrast. COMPARISON:  August 03, 2015 FINDINGS: Brain: The ventricles are normal in size and configuration. There is no intracranial mass, hemorrhage,  extra-axial fluid collection, or midline shift. Brain parenchyma appears unremarkable. There is no evident acute infarct. Vascular: There is no appreciable hyperdense vessel. There is no appreciable vascular calcification. Skull: The bony calvarium appears intact. Sinuses/Orbits: Visualized paranasal sinuses are clear. Visualized orbits appear symmetric bilaterally. Other: Mastoid air cells are clear. IMPRESSION: Study within normal limits. Electronically Signed   By: Bretta Bang III M.D.   On: 05/03/2018 08:10    Procedures Procedures (including critical care time)  Medications Ordered in ED Medications  sodium chloride 0.9 % bolus 1,000 mL (has no administration in time range)  acetaminophen (TYLENOL) tablet 1,000 mg (has no administration in time range)  prochlorperazine (COMPAZINE) injection 10 mg (has no administration in time range)  diphenhydrAMINE (BENADRYL) injection 25 mg (has no administration in time range)     Initial Impression / Assessment and Plan / ED Course  I have reviewed the triage vital signs and the nursing notes.  Pertinent labs & imaging results that were available during my care of the patient were reviewed by me and considered in my medical decision making (see chart for details).  Clinical Course as of May 03 842  Wed May 03, 2018  0716 Pt crying and anxious during this reading. Will reassess shortly. Until reassessed, do not feel that HA is representative of HTN urgency at this time. If persistently elevated, will administer hydralazine.   BP(!): 180/136 [AM]  0726 Patient verbally verified a safe ride from the ED. Proceeded with prescribing IV Benadryl for pain/relaxtion/muscle relaxation in the ED.    [AM]  [phone] Patient reports resolution of headache to a 2 out of 10.  Her blood pressure is now 144/97.  Do not feel that this is representative of hypertensive urgency.  She  feels well enough to return home.  Return precautions discussed with patient.    [AM]    Clinical Course User Index [AM] Elisha Ponder, PA-C       Patient without high-risk features of headache including: sudden onset/thunderclap HA, no similar headache in past, altered mental status, accompanying seizure, headache with exertion, age > 27, history of immunocompromise, neck or shoulder pain, fever, use of anticoagulation, family history of spontaneous SAH.  Patient does however have a family history of brain aneurysm in both her maternal grandmother and mother, and her headache to begin suddenly.  Blood pressure significant elevated on arrival, however patient is anxious and tearful.  Given the family history and blood pressure at this time, and that patient is presenting within 6 hours of onset of symptoms, will obtain CT noncontrast assessing for [facility].   Patient has a normal complete neurological exam, normal vital signs, normal level of consciousness, no signs of meningismus, is well-appearing/non-toxic appearing, no signs of trauma.   Patient has normal labs.  She is a nonspecific foot leukocytosis of 11.  CT scan of head is without any acute intracranial abnormality.  This is sensitive for subarachnoid hemorrhage given it is obtained within a 6-hour window of the onset of symptoms.  She is feeling much better after migraine cocktail.  Return precautions given for any worsening headache, neck stiffness, fever with headache, or neurologic signs or symptoms with headache.  No dangerous or life-threatening conditions suspected or identified by history, physical exam, and by work-up. No indications for hospitalization identified.   Final Clinical Impressions(s) / ED Diagnoses   Final diagnoses:  Bad headache  Elevated blood pressure reading without diagnosis of hypertension    ED Discharge Orders    None       Delia Chimes 05/03/18 0847    Benjiman Core, MD 05/03/18 [phone]

## 2018-05-03 NOTE — ED Triage Notes (Signed)
Pt in with c/o sharp L sided HA since waking at 0500. Endorses light sensitivity and blurred vision. Denies any fevers, neck pain or stiffness. C/o mild congestion since last night. Took 3 Aleve PTA. Hx of familial brain aneurysms

## 2018-05-03 NOTE — Discharge Instructions (Signed)
Please see the information and instructions below regarding your visit.  Your diagnoses today include:  1. Bad headache   2. Elevated blood pressure reading without diagnosis of hypertension     You were seen and treated in the emergency department today for headache. Fortunately, your vitals, exam, and work-up is reassuring with no apparent emergent cause for your headache at this time.  Tests performed today include: See side panel of your discharge paperwork for testing performed today. Vital signs are listed at the bottom of these instructions.   Medications prescribed:    Try to avoid daily or regular use of tylenol, aspirin, ibuprofen, and other overt-the-counter pain medications as this can contribute to rebound headaches.   Take any prescribed medications only as prescribed, and any over the counter medications only as directed on the packaging.  Home care instructions:   Drink plenty of fluids at home. This will help with your headache. Be cautious with caffeine use, as this can cause your headache to rebound when the effects wear off. If you drink more than 2 cups of coffee/caffeinated tea, or caffeinated soda per day, I suggest you wean down that amount.  Please follow any educational materials contained in this packet.   Follow-up instructions: Please follow-up with your primary care provider within the month once they are accepting elective appointments for further evaluation of your symptoms if they are not completely improved.   Return instructions:  Please return to the Emergency Department if you experience worsening symptoms. It is VERY important that you monitor your symptoms at home. If you develop worsening headache, new fever, new neck stiffness, rash, focal weakness or numbness, or any other new or concerning symptoms, please return to the ED immediately, as these may be signs that your headache has become a potentially serious and life-threatening condition.   Please return if you have any other emergent concerns.  Additional Information:   Your vital signs today were: BP (!) 144/97    Pulse 70    Temp 98.5 F (36.9 C) (Oral)    Wt 78.5 kg    SpO2 99%    BMI 29.71 kg/m  If your blood pressure (BP) was elevated on multiple readings during this visit above 130 for the top number or above 80 for the bottom number, please have this repeated by your primary care provider within one month. --------------  Thank you for allowing [place] to participate in your care today.

## 2019-05-01 ENCOUNTER — Encounter (HOSPITAL_COMMUNITY): Payer: Self-pay | Admitting: Emergency Medicine

## 2019-05-01 ENCOUNTER — Other Ambulatory Visit: Payer: Self-pay

## 2019-05-01 ENCOUNTER — Emergency Department (HOSPITAL_COMMUNITY)
Admission: EM | Admit: 2019-05-01 | Discharge: 2019-05-01 | Disposition: A | Discharge status: Home / Self Care | Payer: Medicaid Other | Attending: Emergency Medicine | Admitting: Emergency Medicine

## 2019-05-01 DIAGNOSIS — R630 Anorexia: Secondary | ICD-10-CM | POA: Insufficient documentation

## 2019-05-01 DIAGNOSIS — R531 Weakness: Secondary | ICD-10-CM | POA: Diagnosis not present

## 2019-05-01 DIAGNOSIS — Z5321 Procedure and treatment not carried out due to patient leaving prior to being seen by health care provider: Secondary | ICD-10-CM | POA: Insufficient documentation

## 2019-05-01 DIAGNOSIS — R5383 Other fatigue: Secondary | ICD-10-CM | POA: Insufficient documentation

## 2019-05-01 LAB — BASIC METABOLIC PANEL
Anion gap: 11 (ref 5–15)
BUN: 12 mg/dL (ref 6–20)
CO2: 23 mmol/L (ref 22–32)
Calcium: 8.7 mg/dL — ABNORMAL LOW (ref 8.9–10.3)
Chloride: 104 mmol/L (ref 98–111)
Creatinine, Ser: 0.96 mg/dL (ref 0.44–1.00)
GFR calc Af Amer: >60 mL/min (ref >60)
GFR calc non Af Amer: >60 mL/min (ref >60)
Glucose, Bld: 101 mg/dL — ABNORMAL HIGH (ref 70–99)
Potassium: 3.9 mmol/L (ref 3.5–5.1)
Sodium: 138 mmol/L (ref 135–145)

## 2019-05-01 LAB — CBC WITH DIFFERENTIAL/PLATELET
Abs Immature Granulocytes: 0.01 10*3/uL (ref 0.00–0.07)
Basophils Absolute: 0 10*3/uL (ref 0.0–0.1)
Basophils Relative: 0 %
Eosinophils Absolute: 0 10*3/uL (ref 0.0–0.5)
Eosinophils Relative: 0 %
HCT: 44.2 % (ref 36.0–46.0)
Hemoglobin: 14.3 g/dL (ref 12.0–15.0)
Immature Granulocytes: 0 %
Lymphocytes Relative: 34 %
Lymphs Abs: 1.7 10*3/uL (ref 0.7–4.0)
MCH: 30.8 pg (ref 26.0–34.0)
MCHC: 32.4 g/dL (ref 30.0–36.0)
MCV: 95.3 fL (ref 80.0–100.0)
Monocytes Absolute: 0.4 10*3/uL (ref 0.1–1.0)
Monocytes Relative: 8 %
Neutro Abs: 2.9 10*3/uL (ref 1.7–7.7)
Neutrophils Relative %: 58 %
Platelets: 217 10*3/uL (ref 150–400)
RBC: 4.64 MIL/uL (ref 3.87–5.11)
RDW: 12 % (ref 11.5–15.5)
WBC: 5.1 10*3/uL (ref 4.0–10.5)
nRBC: 0 % (ref 0.0–0.2)

## 2019-05-01 NOTE — ED Notes (Signed)
Pt left and stated shes not going to wait. Pt has walked out of the ED.

## 2019-05-01 NOTE — ED Triage Notes (Signed)
Patient reports poor appetite with fatigue and gen. weakness this week , denies fever or chills .

## 2020-09-11 ENCOUNTER — Ambulatory Visit: Payer: Medicaid Other

## 2020-10-10 LAB — HM PAP SMEAR: HM Pap smear: NORMAL

## 2020-11-08 LAB — TSH: TSH: 1 (ref 0.41–5.90)

## 2020-12-18 ENCOUNTER — Other Ambulatory Visit: Payer: Self-pay | Admitting: Ophthalmology

## 2020-12-18 DIAGNOSIS — H4710 Unspecified papilledema: Secondary | ICD-10-CM

## 2020-12-29 ENCOUNTER — Ambulatory Visit
Admission: RE | Admit: 2020-12-29 | Discharge: 2020-12-29 | Disposition: A | Discharge status: Home / Self Care | Payer: Medicaid Other | Source: Ambulatory Visit | Attending: Ophthalmology | Admitting: Ophthalmology

## 2020-12-29 DIAGNOSIS — H4710 Unspecified papilledema: Secondary | ICD-10-CM

## 2020-12-29 MED ORDER — GADOBUTROL 1 MMOL/ML IV SOLN
8.0000 mL | Freq: Once | INTRAVENOUS | Status: AC | PRN
  Administered 2020-12-29: 8 mL via INTRAVENOUS

## 2021-02-17 ENCOUNTER — Ambulatory Visit (INDEPENDENT_AMBULATORY_CARE_PROVIDER_SITE_OTHER): Payer: Medicaid Other | Admitting: Family Medicine

## 2021-02-17 ENCOUNTER — Other Ambulatory Visit: Payer: Self-pay

## 2021-02-17 ENCOUNTER — Encounter: Payer: Self-pay | Admitting: Family Medicine

## 2021-02-17 VITALS — BP 130/100 | HR 68 | Ht 65.0 in | Wt 179.0 lb

## 2021-02-17 DIAGNOSIS — G932 Benign intracranial hypertension: Secondary | ICD-10-CM | POA: Diagnosis not present

## 2021-02-17 DIAGNOSIS — R030 Elevated blood-pressure reading, without diagnosis of hypertension: Secondary | ICD-10-CM

## 2021-02-17 DIAGNOSIS — R946 Abnormal results of thyroid function studies: Secondary | ICD-10-CM

## 2021-02-17 DIAGNOSIS — Z7689 Persons encountering health services in other specified circumstances: Secondary | ICD-10-CM | POA: Diagnosis not present

## 2021-02-17 DIAGNOSIS — Z8669 Personal history of other diseases of the nervous system and sense organs: Secondary | ICD-10-CM | POA: Diagnosis not present

## 2021-02-17 DIAGNOSIS — R519 Headache, unspecified: Secondary | ICD-10-CM

## 2021-02-17 NOTE — Progress Notes (Signed)
Date:  02/17/2021   Name:  [name]   DOB:  1986/01/29   MRN:  [id number]   Chief Complaint: Establish Care (Thyroid/ TSH was 1.00 on 11/08/20)  [name] is a 36 y.o. female who presents today for establishing care. She feels well. She reports exercising daily. She reports she is sleeping poorly.     Lab Results  Component Value Date   NA 138 05/01/2019   K 3.9 05/01/2019   CO2 23 05/01/2019   GLUCOSE 101 (H) 05/01/2019   BUN 12 05/01/2019   CREATININE 0.96 05/01/2019   CALCIUM 8.7 (L) 05/01/2019   GFRNONAA >60 05/01/2019   No results found for: CHOL, HDL, LDLCALC, LDLDIRECT, TRIG, CHOLHDL Lab Results  Component Value Date   TSH 1.00 11/08/2020   No results found for: HGBA1C Lab Results  Component Value Date   WBC 5.1 05/01/2019   HGB 14.3 05/01/2019   HCT 44.2 05/01/2019   MCV 95.3 05/01/2019   PLT 217 05/01/2019   Lab Results  Component Value Date   ALT 12 05/03/2018   AST 18 05/03/2018   ALKPHOS 53 05/03/2018   BILITOT 0.6 05/03/2018   No results found for: 25OHVITD2, 25OHVITD3, VD25OH   Review of Systems  Constitutional: Negative.  Negative for chills, fatigue, fever and unexpected weight change.  HENT:  Negative for congestion, ear discharge, ear pain, nosebleeds, postnasal drip, rhinorrhea, sinus pressure, sneezing and sore throat.   Eyes:  Negative for photophobia, pain, discharge, redness and itching.  Respiratory:  Negative for cough, shortness of breath, wheezing and stridor.   Cardiovascular:  Negative for chest pain, palpitations and leg swelling.  Gastrointestinal:  Negative for abdominal pain, blood in stool, constipation, diarrhea, nausea and vomiting.  Endocrine: Negative for cold intolerance, heat intolerance, polydipsia, polyphagia and polyuria.  Genitourinary:  Negative for dysuria, flank pain, frequency, hematuria, menstrual problem, pelvic pain, urgency, vaginal bleeding and vaginal discharge.  Musculoskeletal:   Negative for arthralgias, back pain and myalgias.  Skin:  Negative for rash.  Allergic/Immunologic: Negative for environmental allergies and food allergies.  Neurological:  Negative for dizziness, weakness, light-headedness, numbness and headaches.  Hematological:  Negative for adenopathy. Does not bruise/bleed easily.  Psychiatric/Behavioral:  Negative for dysphoric mood. The patient is not nervous/anxious.    Patient Active Problem List   Diagnosis Date Noted   Normal labor and delivery 01/10/2015   SVD (spontaneous vaginal delivery) 01/10/2015   Normal pregnancy, repeat 01/10/2015   Normal pregnancy in multigravida in third trimester 01/09/2015    No Known Allergies  Past Surgical History:  Procedure Laterality Date   DILATION AND CURETTAGE OF UTERUS     TONSILLECTOMY     WISDOM TOOTH EXTRACTION      Social History   Tobacco Use   Smoking status: Never   Smokeless tobacco: Never  Substance Use Topics   Alcohol use: Yes    Comment: occ   Drug use: No     Medication list has been reviewed and updated.  Current Meds  Medication Sig   albuterol (PROVENTIL HFA;VENTOLIN HFA) 108 (90 BASE) MCG/ACT inhaler Inhale 2 puffs into the lungs every 6 (six) hours as needed for wheezing or shortness of breath.    PHQ 2/9 Scores 02/17/2021  PHQ - 2 Score 0  PHQ- 9 Score 2    GAD 7 : Generalized Anxiety Score 02/17/2021  Nervous, Anxious, on Edge 1  Control/stop worrying 0  Worry too much - different things 0  Trouble relaxing 0  Restless 0  Easily annoyed or irritable 2  Afraid - awful might happen 2  Total GAD 7 Score 5  Anxiety Difficulty Not difficult at all    BP Readings from Last 3 Encounters:  02/17/21 (!) 130/100  05/01/19 126/72  05/03/18 (!) 144/97    Physical Exam Vitals and nursing note reviewed.  Constitutional:      Appearance: She is well-developed.  HENT:     Head: Normocephalic.     Right Ear: Tympanic membrane, ear canal and external ear  normal.     Left Ear: Tympanic membrane, ear canal and external ear normal.     Nose: No congestion or rhinorrhea.  Eyes:     General: Lids are everted, no foreign bodies appreciated. No scleral icterus.       Left eye: No foreign body or hordeolum.     Conjunctiva/sclera: Conjunctivae normal.     Right eye: Right conjunctiva is not injected.     Left eye: Left conjunctiva is not injected.     Pupils: Pupils are equal, round, and reactive to light.  Neck:     Thyroid: No thyromegaly.     Vascular: No JVD.     Trachea: No tracheal deviation.  Cardiovascular:     Rate and Rhythm: Normal rate and regular rhythm.     Heart sounds: Normal heart sounds. No murmur heard.   No friction rub. No gallop.  Pulmonary:     Effort: Pulmonary effort is normal. No respiratory distress.     Breath sounds: Normal breath sounds. No stridor. No wheezing, rhonchi or rales.  Abdominal:     General: Bowel sounds are normal.     Palpations: Abdomen is soft. There is no mass.     Tenderness: There is no abdominal tenderness. There is no guarding or rebound.  Musculoskeletal:        General: No tenderness. Normal range of motion.     Cervical back: Normal range of motion and neck supple.  Lymphadenopathy:     Cervical: No cervical adenopathy.  Skin:    General: Skin is warm.     Findings: No rash.  Neurological:     Mental Status: She is alert and oriented to person, place, and time.     Cranial Nerves: No cranial nerve deficit.     Deep Tendon Reflexes: Reflexes normal.  Psychiatric:        Mood and Affect: Mood is not anxious or depressed.    Wt Readings from Last 3 Encounters:  02/17/21 179 lb (81.2 kg)  05/01/19 176 lb 5.9 oz (80 kg)  05/03/18 173 lb 1 oz (78.5 kg)    BP (!) 130/100    Pulse 68    Ht 5\' 5"  (1.651 m)    Wt 179 lb (81.2 kg)    BMI 29.79 kg/m   Assessment and Plan:  1. Establishing care with new doctor, encounter for Patient establishing care with new physician.  Patient  has not had primary care other than OB/GYN and revisit recently diagnosed with #2  2. Benign intracranial hypertension Patient recently diagnosed with pseudotumor cerebri/benign intracranial hypertension presumably as a result of an eye exam that noted papilledema and underwent an MRI.  3. Elevated blood pressure reading in office without diagnosis of hypertension Patient has elevated pressure without hypertension and may require acetazolamide but needs formal evaluation of pseudotumor cerebri/intracranial benign hypertension.  4. History of papilledema As noted by ophthalmology has papilledema but has  not been formally evaluated beyond MRI.  5. Recurrent headache Patient has recurrent headaches which are times severe enough that she needs evaluation in an ER setting.  In the future I have suggested just that she go to [facility] where she went with the last one and that she has a connection there with the upcoming neurology appointment at [facility].  6. Abnormal thyroid function test Patient has a history of an abnormal thyroid function test that was told that her TSH was 1.00 which by our parameters would be within normal limits but was told that this is a low.  Will repeat just the TSH and treat if necessary. - TSH

## 2021-02-18 LAB — TSH: TSH: 1.83 u[IU]/mL (ref 0.450–4.500)

## 2021-02-19 LAB — T4: T4, Total: 6.4 ug/dL (ref 4.5–12.0)

## 2021-02-19 LAB — SPECIMEN STATUS REPORT

## 2021-05-11 ENCOUNTER — Ambulatory Visit (INDEPENDENT_AMBULATORY_CARE_PROVIDER_SITE_OTHER): Payer: Medicaid Other | Admitting: Family Medicine

## 2021-05-11 ENCOUNTER — Encounter: Payer: Self-pay | Admitting: Family Medicine

## 2021-05-11 VITALS — BP 120/80 | HR 68 | Ht 65.0 in | Wt 185.0 lb

## 2021-05-11 DIAGNOSIS — R030 Elevated blood-pressure reading, without diagnosis of hypertension: Secondary | ICD-10-CM

## 2021-05-11 DIAGNOSIS — F4323 Adjustment disorder with mixed anxiety and depressed mood: Secondary | ICD-10-CM | POA: Diagnosis not present

## 2021-05-11 NOTE — Patient Instructions (Signed)

## 2021-05-11 NOTE — Progress Notes (Signed)
? ? ?Date:  05/11/2021  ? ?Name:  [name]   DOB:  1985-07-04   MRN:  [id number] ? ? ?Chief Complaint: Follow-up and ADHD (Ref to psych) ? ?Depression ?       This is a chronic problem.  The current episode started more than 1 year ago.   The onset quality is gradual.   The problem occurs intermittently.  The problem has been waxing and waning since onset.  Associated symptoms include decreased concentration, helplessness, irritable, decreased interest and sad.  Associated symptoms include no fatigue, no hopelessness, does not have insomnia, no restlessness, no appetite change, no body aches, no myalgias, no headaches, no indigestion and no suicidal ideas.     The symptoms are aggravated by family issues.  Past treatments include nothing.  Compliance with treatment is good.  Previous treatment provided no relief relief. ? ?Lab Results  ?Component Value Date  ? NA 138 05/01/2019  ? K 3.9 05/01/2019  ? CO2 23 05/01/2019  ? GLUCOSE 101 (H) 05/01/2019  ? BUN 12 05/01/2019  ? CREATININE 0.96 05/01/2019  ? CALCIUM 8.7 (L) 05/01/2019  ? GFRNONAA >60 05/01/2019  ? ?No results found for: CHOL, HDL, LDLCALC, LDLDIRECT, TRIG, CHOLHDL ?Lab Results  ?Component Value Date  ? TSH 1.830 02/17/2021  ? ?No results found for: HGBA1C ?Lab Results  ?Component Value Date  ? WBC 5.1 05/01/2019  ? HGB 14.3 05/01/2019  ? HCT 44.2 05/01/2019  ? MCV 95.3 05/01/2019  ? PLT 217 05/01/2019  ? ?Lab Results  ?Component Value Date  ? ALT 12 05/03/2018  ? AST 18 05/03/2018  ? ALKPHOS 53 05/03/2018  ? BILITOT 0.6 05/03/2018  ? ?No results found for: 25OHVITD2, 25OHVITD3, VD25OH  ? ?Review of Systems  ?Constitutional:  Negative for appetite change, chills, fatigue and fever.  ?HENT:  Negative for drooling, ear discharge, ear pain and sore throat.   ?Respiratory:  Negative for cough, shortness of breath and wheezing.   ?Cardiovascular:  Negative for chest pain, palpitations and leg swelling.  ?Gastrointestinal:  Negative for abdominal pain,  blood in stool, constipation, diarrhea and nausea.  ?Endocrine: Negative for polydipsia.  ?Genitourinary:  Negative for dysuria, frequency, hematuria and urgency.  ?Musculoskeletal:  Negative for back pain, myalgias and neck pain.  ?Skin:  Negative for rash.  ?Allergic/Immunologic: Negative for environmental allergies.  ?Neurological:  Negative for dizziness and headaches.  ?Hematological:  Does not bruise/bleed easily.  ?Psychiatric/Behavioral:  Positive for decreased concentration and depression. Negative for suicidal ideas. The patient is not nervous/anxious and does not have insomnia.   ? ?Patient Active Problem List  ? Diagnosis Date Noted  ? Normal labor and delivery 01/10/2015  ? SVD (spontaneous vaginal delivery) 01/10/2015  ? Normal pregnancy, repeat 01/10/2015  ? Normal pregnancy in multigravida in third trimester 01/09/2015  ? ? ?No Known Allergies ? ?Past Surgical History:  ?Procedure Laterality Date  ? DILATION AND CURETTAGE OF UTERUS    ? TONSILLECTOMY    ? WISDOM TOOTH EXTRACTION    ? ? ?Social History  ? ?Tobacco Use  ? Smoking status: Never  ? Smokeless tobacco: Never  ?Substance Use Topics  ? Alcohol use: Yes  ?  Comment: occ  ? Drug use: No  ? ? ? ?Medication list has been reviewed and updated. ? ?Current Meds  ?Medication Sig  ? albuterol (PROVENTIL HFA;VENTOLIN HFA) 108 (90 BASE) MCG/ACT inhaler Inhale 2 puffs into the lungs every 6 (six) hours as needed for wheezing or shortness  of breath.  ? topiramate (TOPAMAX) 25 MG tablet Take 25 mg by mouth daily. neuro  ? ? ? ?  05/11/2021  ?  2:00 PM 02/17/2021  ?  2:35 PM  ?GAD 7 : Generalized Anxiety Score  ?Nervous, Anxious, on Edge 0 1  ?Control/stop worrying 2 0  ?Worry too much - different things 2 0  ?Trouble relaxing 2 0  ?Restless 0 0  ?Easily annoyed or irritable 2 2  ?Afraid - awful might happen 1 2  ?Total GAD 7 Score 9 5  ?Anxiety Difficulty Somewhat difficult Not difficult at all  ? ? ? ?  05/11/2021  ?  1:59 PM  ?Depression screen PHQ 2/9   ?Decreased Interest 1  ?Down, Depressed, Hopeless 1  ?PHQ - 2 Score 2  ?Altered sleeping 1  ?Tired, decreased energy 3  ?Change in appetite 0  ?Feeling bad or failure about yourself  1  ?Trouble concentrating 3  ?Moving slowly or fidgety/restless 0  ?Suicidal thoughts 0  ?PHQ-9 Score 10  ?Difficult doing work/chores Not difficult at all  ? ? ?BP Readings from Last 3 Encounters:  ?05/11/21 120/80  ?02/17/21 (!) 130/100  ?05/01/19 126/72  ? ? ?Physical Exam ?Vitals and nursing note reviewed.  ?Constitutional:   ?   General: She is irritable.  ?   Appearance: She is well-developed.  ?HENT:  ?   Head: Normocephalic.  ?   Right Ear: Tympanic membrane, ear canal and external ear normal. There is no impacted cerumen.  ?   Left Ear: Tympanic membrane, ear canal and external ear normal. There is no impacted cerumen.  ?   Nose: Nose normal. No congestion or rhinorrhea.  ?   Mouth/Throat:  ?   Mouth: Mucous membranes are moist.  ?Eyes:  ?   General: Lids are everted, no foreign bodies appreciated. No scleral icterus.    ?   Left eye: No foreign body or hordeolum.  ?   Conjunctiva/sclera: Conjunctivae normal.  ?   Right eye: Right conjunctiva is not injected.  ?   Left eye: Left conjunctiva is not injected.  ?   Pupils: Pupils are equal, round, and reactive to light.  ?Neck:  ?   Thyroid: No thyromegaly.  ?   Vascular: No JVD.  ?   Trachea: No tracheal deviation.  ?Cardiovascular:  ?   Rate and Rhythm: Normal rate and regular rhythm.  ?   Heart sounds: Normal heart sounds. No murmur heard. ?  No friction rub. No gallop.  ?Pulmonary:  ?   Effort: Pulmonary effort is normal. No respiratory distress.  ?   Breath sounds: Normal breath sounds. No wheezing, rhonchi or rales.  ?Abdominal:  ?   General: Bowel sounds are normal.  ?   Palpations: Abdomen is soft. There is no mass.  ?   Tenderness: There is no abdominal tenderness. There is no guarding or rebound.  ?Musculoskeletal:     ?   General: No tenderness. Normal range of motion.   ?   Cervical back: Normal range of motion and neck supple.  ?Lymphadenopathy:  ?   Cervical: No cervical adenopathy.  ?Skin: ?   General: Skin is warm.  ?   Findings: No rash.  ?Neurological:  ?   Mental Status: She is alert and oriented to person, place, and time.  ?   Cranial Nerves: No cranial nerve deficit.  ?   Deep Tendon Reflexes: Reflexes normal.  ?Psychiatric:     ?  Mood and Affect: Mood is not anxious or depressed.  ? ? ?Wt Readings from Last 3 Encounters:  ?05/11/21 185 lb (83.9 kg)  ?02/17/21 179 lb (81.2 kg)  ?05/01/19 176 lb 5.9 oz (80 kg)  ? ? ?BP 120/80   Pulse 68   Ht 5\' 5"  (1.651 m)   Wt 185 lb (83.9 kg)   BMI 30.79 kg/m?  ? ?Assessment and Plan: ? ?1. Adjustment reaction with anxiety and depression ?Chronic.  Intermittent.  Relatively stable but PHQ of 10 Gad score of 9.  This is an adjustment circumstance given some family concerns at this time.  Patient had been on S-Citalopram about 15 to 18 years ago but there may have been a bit that there was self-harm possibility and she has not been on SSRI since.  We will refer to psychiatry for further evaluation and patient is also interested in exploring the possibility of ADHD which I have concerns given her history of elevated blood pressure in the past and she is currently being evaluated by neurology for pseudotumor cerebri.  She is currently taking Topamax for migraine prophylaxis. ?- Ambulatory referral to Psychiatry ? ?2. Elevated blood pressure reading in office without diagnosis of hypertension ?Patient has had elevated blood pressure readings in the past although it is in normal range today 120/80.  Patient has been given a Dash diet to use the guidelines to keep her blood pressure under control and also to avoid excess sodium intake.  This would be a concern if patient decides to to pursue ADHD per psychiatry and if so I would encourage her to return for blood pressure readings to her her primary care in the future. ? ? ?

## 2021-08-30 ENCOUNTER — Encounter: Payer: Self-pay | Admitting: Emergency Medicine

## 2021-08-30 ENCOUNTER — Emergency Department: Payer: Medicaid Other

## 2021-08-30 ENCOUNTER — Emergency Department
Admission: EM | Admit: 2021-08-30 | Discharge: 2021-08-30 | Disposition: A | Discharge status: Home / Self Care | Payer: Medicaid Other | Attending: Emergency Medicine | Admitting: Emergency Medicine

## 2021-08-30 ENCOUNTER — Other Ambulatory Visit: Payer: Self-pay

## 2021-08-30 DIAGNOSIS — S161XXA Strain of muscle, fascia and tendon at neck level, initial encounter: Secondary | ICD-10-CM | POA: Insufficient documentation

## 2021-08-30 DIAGNOSIS — J45909 Unspecified asthma, uncomplicated: Secondary | ICD-10-CM | POA: Diagnosis not present

## 2021-08-30 DIAGNOSIS — S60111A Contusion of right thumb with damage to nail, initial encounter: Secondary | ICD-10-CM | POA: Insufficient documentation

## 2021-08-30 DIAGNOSIS — G44319 Acute post-traumatic headache, not intractable: Secondary | ICD-10-CM | POA: Insufficient documentation

## 2021-08-30 DIAGNOSIS — S199XXA Unspecified injury of neck, initial encounter: Secondary | ICD-10-CM | POA: Diagnosis present

## 2021-08-30 DIAGNOSIS — Y92410 Unspecified street and highway as the place of occurrence of the external cause: Secondary | ICD-10-CM | POA: Insufficient documentation

## 2021-08-30 LAB — URINALYSIS, ROUTINE W REFLEX MICROSCOPIC
Bilirubin Urine: NEGATIVE
Glucose, UA: NEGATIVE mg/dL
Hgb urine dipstick: NEGATIVE
Ketones, ur: NEGATIVE mg/dL
Leukocytes,Ua: NEGATIVE
Nitrite: NEGATIVE
Protein, ur: NEGATIVE mg/dL
Specific Gravity, Urine: 1.011 (ref 1.005–1.030)
pH: 7 (ref 5.0–8.0)

## 2021-08-30 LAB — POC URINE PREG, ED: Preg Test, Ur: NEGATIVE

## 2021-08-30 MED ORDER — OXYCODONE-ACETAMINOPHEN 5-325 MG PO TABS
1.0000 | ORAL_TABLET | Freq: Once | ORAL | Status: AC
  Administered 2021-08-30: 1 via ORAL
  Filled 2021-08-30: qty 1

## 2021-08-30 MED ORDER — OXYCODONE-ACETAMINOPHEN 5-325 MG PO TABS: 1.0000 | ORAL_TABLET | Freq: Four times a day (QID) | ORAL | 0 refills | Status: DC | PRN
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
## 2021-08-30 NOTE — Discharge Instructions (Addendum)
Follow-up with your primary care provider if any continued problems or concerns.  You will be sore for approximately 4 to 5 days even with medication.  The medication sent to the pharmacy is a narcotic and should not be taken if you are driving or operating machinery.  You may also take ibuprofen over-the-counter 3 tablets with food 3 times a day which will help with inflammation.  Ice or heat to your muscles as needed for discomfort.  Also a splint was applied to your thumb for protection only.  You may remove it at any time especially to shower.
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
## 2021-08-31 ENCOUNTER — Ambulatory Visit: Payer: Medicaid Other | Admitting: Family Medicine

## 2021-09-08 ENCOUNTER — Ambulatory Visit: Payer: Medicaid Other | Admitting: Family Medicine

## 2021-11-20 DIAGNOSIS — E559 Vitamin D deficiency, unspecified: Secondary | ICD-10-CM | POA: Insufficient documentation

## 2021-11-25 ENCOUNTER — Ambulatory Visit: Payer: Self-pay | Admitting: *Deleted

## 2021-11-25 ENCOUNTER — Ambulatory Visit: Payer: Medicaid Other | Admitting: Internal Medicine

## 2021-11-25 NOTE — Telephone Encounter (Signed)
  Chief Complaint: Hypertension Symptoms: BP today 160/101 Frequency: today, mild headache Pertinent Negatives: Patient denies weakness, CP Disposition: [] ED /[] Urgent Care (no appt availability in office) / [x] Appointment(In office/virtual)/ []  [facility]/ [] Home Care/ [] Refused Recommended Disposition /[] [facility]/ []  Follow-up with PCP Additional Notes: Appt secured for Friday. Care advise provided, call disconnected before pt verbalized understanding Reason for Disposition  Systolic BP  >= 570 OR Diastolic >= 177  Answer Assessment - Initial Assessment Questions 1. BLOOD PRESSURE: "What is the blood pressure?" "Did you take at least two measurements 5 minutes apart?"     160/101 2. ONSET: "When did you take your blood pressure?"     Clinical class, learning VS, CMA class 3. HOW: "How did you take your blood pressure?" (e.g., automatic home BP monitor, visiting nurse)     In a class 4. HISTORY: "Do you have a history of high blood pressure?"     No 5. MEDICINES: "Are you taking any medicines for blood pressure?" "Have you missed any doses recently?"     no 6. OTHER SYMPTOMS: "Do you have any symptoms?" (e.g., blurred vision, chest pain, difficulty breathing, headache, weakness)     Mild headache, dizzy at times not presently.  Protocols used: Blood Pressure - High-A-AH

## 2021-11-25 NOTE — Telephone Encounter (Signed)
Pt was talking with one of the other triage nurses when the line disconnected.   She called back because she wanted to be sure there wasn't anything else she needed to know and to confirm her appt. Was made.  I let her know her appt. Did go through and was confirmed.   She didn't have any other questions.

## 2021-11-27 ENCOUNTER — Other Ambulatory Visit: Payer: Self-pay

## 2021-11-27 ENCOUNTER — Emergency Department (HOSPITAL_BASED_OUTPATIENT_CLINIC_OR_DEPARTMENT_OTHER): Payer: Medicaid Other | Admitting: Radiology

## 2021-11-27 ENCOUNTER — Encounter (HOSPITAL_BASED_OUTPATIENT_CLINIC_OR_DEPARTMENT_OTHER): Payer: Self-pay

## 2021-11-27 ENCOUNTER — Ambulatory Visit: Payer: Medicaid Other | Admitting: Family Medicine

## 2021-11-27 DIAGNOSIS — S60211A Contusion of right wrist, initial encounter: Secondary | ICD-10-CM | POA: Insufficient documentation

## 2021-11-27 DIAGNOSIS — B9789 Other viral agents as the cause of diseases classified elsewhere: Secondary | ICD-10-CM | POA: Insufficient documentation

## 2021-11-27 DIAGNOSIS — Z20822 Contact with and (suspected) exposure to covid-19: Secondary | ICD-10-CM | POA: Diagnosis not present

## 2021-11-27 DIAGNOSIS — J069 Acute upper respiratory infection, unspecified: Secondary | ICD-10-CM | POA: Diagnosis not present

## 2021-11-27 DIAGNOSIS — S6991XA Unspecified injury of right wrist, hand and finger(s), initial encounter: Secondary | ICD-10-CM | POA: Diagnosis present

## 2021-11-27 DIAGNOSIS — M545 Low back pain, unspecified: Secondary | ICD-10-CM | POA: Insufficient documentation

## 2021-11-27 DIAGNOSIS — Y92410 Unspecified street and highway as the place of occurrence of the external cause: Secondary | ICD-10-CM | POA: Insufficient documentation

## 2021-11-27 LAB — RESP PANEL BY RT-PCR (FLU A&B, COVID) ARPGX2
Influenza A by PCR: NEGATIVE
Influenza B by PCR: NEGATIVE
SARS Coronavirus 2 by RT PCR: NEGATIVE
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
## 2021-11-28 ENCOUNTER — Emergency Department (HOSPITAL_BASED_OUTPATIENT_CLINIC_OR_DEPARTMENT_OTHER)
Admission: EM | Admit: 2021-11-28 | Discharge: 2021-11-28 | Disposition: A | Discharge status: Home / Self Care | Payer: Medicaid Other | Attending: Emergency Medicine | Admitting: Emergency Medicine

## 2021-11-28 DIAGNOSIS — J069 Acute upper respiratory infection, unspecified: Secondary | ICD-10-CM

## 2021-11-28 DIAGNOSIS — S60211A Contusion of right wrist, initial encounter: Secondary | ICD-10-CM

## 2021-11-28 MED ORDER — IBUPROFEN 600 MG PO TABS: 600.0000 mg | ORAL_TABLET | Freq: Three times a day (TID) | ORAL | 0 refills | Status: DC | PRN

## 2021-11-28 MED ORDER — IBUPROFEN 400 MG PO TABS
600.0000 mg | ORAL_TABLET | Freq: Once | ORAL | Status: AC
  Administered 2021-11-28: 600 mg via ORAL
  Filled 2021-11-28: qty 1
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
## 2021-12-04 ENCOUNTER — Ambulatory Visit (INDEPENDENT_AMBULATORY_CARE_PROVIDER_SITE_OTHER): Payer: Medicaid Other | Admitting: Family Medicine

## 2021-12-04 ENCOUNTER — Encounter: Payer: Self-pay | Admitting: Family Medicine

## 2021-12-04 VITALS — BP 148/100 | HR 67 | Ht 65.0 in | Wt 188.0 lb

## 2021-12-04 DIAGNOSIS — I10 Essential (primary) hypertension: Secondary | ICD-10-CM

## 2021-12-04 MED ORDER — CHLORTHALIDONE 25 MG PO TABS: 25.0000 mg | ORAL_TABLET | Freq: Every day | ORAL | 0 refills | Status: DC

## 2021-12-04 NOTE — Progress Notes (Signed)
Date:  12/04/2021   Name:  [name]   DOB:  December 31, 1985   MRN:  [id number]   Chief Complaint: Hypertension  Hypertension This is a chronic problem. The current episode started more than 1 month ago. The problem has been gradually worsening since onset. The problem is uncontrolled. Associated symptoms include headaches. Pertinent negatives include no blurred vision, chest pain, malaise/fatigue, neck pain, orthopnea, palpitations, peripheral edema, PND, shortness of breath or sweats. There are no associated agents to hypertension. Risk factors for coronary artery disease include obesity. The current treatment provides no improvement. There are no compliance problems.  There is no history of angina, CAD/MI or CVA.    Lab Results  Component Value Date   NA 138 05/01/2019   K 3.9 05/01/2019   CO2 23 05/01/2019   GLUCOSE 101 (H) 05/01/2019   BUN 12 05/01/2019   CREATININE 0.96 05/01/2019   CALCIUM 8.7 (L) 05/01/2019   GFRNONAA >60 05/01/2019   No results found for: "CHOL", "HDL", "LDLCALC", "LDLDIRECT", "TRIG", "CHOLHDL" Lab Results  Component Value Date   TSH 1.830 02/17/2021   No results found for: "HGBA1C" Lab Results  Component Value Date   WBC 5.1 05/01/2019   HGB 14.3 05/01/2019   HCT 44.2 05/01/2019   MCV 95.3 05/01/2019   PLT 217 05/01/2019   Lab Results  Component Value Date   ALT 12 05/03/2018   AST 18 05/03/2018   ALKPHOS 53 05/03/2018   BILITOT 0.6 05/03/2018   No results found for: "25OHVITD2", "25OHVITD3", "VD25OH"   Review of Systems  Constitutional:  Negative for chills, fever and malaise/fatigue.  HENT:  Negative for ear discharge, ear pain and sore throat.   Eyes:  Negative for blurred vision.  Respiratory:  Negative for cough, shortness of breath and wheezing.   Cardiovascular:  Negative for chest pain, palpitations, orthopnea, leg swelling and PND.  Gastrointestinal:  Negative for abdominal pain, blood in stool, constipation, diarrhea and  nausea.  Endocrine: Negative for polydipsia.  Genitourinary:  Negative for dysuria, frequency, hematuria and urgency.  Musculoskeletal:  Negative for back pain, myalgias and neck pain.  Skin:  Negative for rash.  Allergic/Immunologic: Negative for environmental allergies.  Neurological:  Positive for headaches. Negative for dizziness.  Hematological:  Does not bruise/bleed easily.  Psychiatric/Behavioral:  Negative for suicidal ideas. The patient is not nervous/anxious.     Patient Active Problem List   Diagnosis Date Noted   Normal labor and delivery 01/10/2015   SVD (spontaneous vaginal delivery) 01/10/2015   Normal pregnancy, repeat 01/10/2015   Normal pregnancy in multigravida in third trimester 01/09/2015    No Known Allergies  Past Surgical History:  Procedure Laterality Date   DILATION AND CURETTAGE OF UTERUS     TONSILLECTOMY     WISDOM TOOTH EXTRACTION      Social History   Tobacco Use   Smoking status: Never   Smokeless tobacco: Never  Substance Use Topics   Alcohol use: Yes    Comment: occ   Drug use: No     Medication list has been reviewed and updated.  Current Meds  Medication Sig   albuterol (PROVENTIL HFA;VENTOLIN HFA) 108 (90 BASE) MCG/ACT inhaler Inhale 2 puffs into the lungs every 6 (six) hours as needed for wheezing or shortness of breath.   D3-50 1.25 MG (50000 UT) capsule Take 50,000 Units by mouth once a week. gyn   ibuprofen (ADVIL) 600 MG tablet Take 1 tablet (600 mg total) by mouth every  8 (eight) hours as needed.   topiramate (TOPAMAX) 25 MG tablet Take 25 mg by mouth daily. neuro       12/04/2021    9:33 AM 05/11/2021    2:00 PM 02/17/2021    2:35 PM  GAD 7 : Generalized Anxiety Score  Nervous, Anxious, on Edge 0 0 1  Control/stop worrying 0 2 0  Worry too much - different things 0 2 0  Trouble relaxing 0 2 0  Restless 0 0 0  Easily annoyed or irritable 0 2 2  Afraid - awful might happen 0 1 2  Total GAD 7 Score 0 9 5  Anxiety  Difficulty Not difficult at all Somewhat difficult Not difficult at all       12/04/2021    9:32 AM 05/11/2021    1:59 PM 02/17/2021    2:35 PM  Depression screen PHQ 2/9  Decreased Interest 0 1 0  Down, Depressed, Hopeless 0 1 0  PHQ - 2 Score 0 2 0  Altered sleeping 0 1 2  Tired, decreased energy 0 3 0  Change in appetite 0 0 0  Feeling bad or failure about yourself  0 1 0  Trouble concentrating 0 3 0  Moving slowly or fidgety/restless 0 0 0  Suicidal thoughts 0 0 0  PHQ-9 Score 0 10 2  Difficult doing work/chores Not difficult at all Not difficult at all Not difficult at all    BP Readings from Last 3 Encounters:  12/04/21 (!) 148/100  11/28/21 (!) 134/102  08/30/21 (!) 160/100    Physical Exam Vitals and nursing note reviewed. Exam conducted with a chaperone present.  Constitutional:      General: She is not in acute distress.    Appearance: She is not diaphoretic.  HENT:     Head: Normocephalic and atraumatic.     Right Ear: External ear normal.     Left Ear: External ear normal.     Nose: Nose normal. No congestion or rhinorrhea.     Mouth/Throat:     Mouth: Mucous membranes are moist.  Eyes:     General:        Right eye: No discharge.        Left eye: No discharge.     Conjunctiva/sclera: Conjunctivae normal.     Pupils: Pupils are equal, round, and reactive to light.  Neck:     Thyroid: No thyromegaly.     Vascular: No JVD.  Cardiovascular:     Rate and Rhythm: Normal rate and regular rhythm.     Heart sounds: Normal heart sounds. No murmur heard.    No friction rub. No gallop.  Pulmonary:     Effort: Pulmonary effort is normal.     Breath sounds: Normal breath sounds. No rhonchi or rales.  Abdominal:     General: Bowel sounds are normal.     Palpations: Abdomen is soft. There is no mass.     Tenderness: There is no abdominal tenderness. There is no guarding.  Musculoskeletal:        General: Normal range of motion.     Cervical back: Normal range  of motion and neck supple.  Lymphadenopathy:     Cervical: No cervical adenopathy.  Skin:    General: Skin is warm and dry.  Neurological:     Mental Status: She is alert.     Deep Tendon Reflexes: Reflexes are normal and symmetric.     Wt Readings from Last 3  Encounters:  12/04/21 188 lb (85.3 kg)  11/27/21 182 lb (82.6 kg)  08/30/21 180 lb (81.6 kg)    BP (!) 148/100 (BP Location: Right Arm, Cuff Size: Large)   Pulse 67   Ht 5\' 5"  (1.651 m)   Wt 188 lb (85.3 kg)   LMP 11/10/2021 (Exact Date)   SpO2 99%   BMI 31.28 kg/m   Assessment and Plan: 1. Primary hypertension Chronic.  Uncontrolled.  Stable.  Blood pressure readings at school have been elevated.  Blood pressure is elevated here today at 148/100.  Discussed with patient and we will initiate chlorthalidone 25 mg once a day.  And will recheck in 6 weeks.  Patient is also been encouraged to avoid sodium intake. - chlorthalidone (HYGROTON) 25 MG tablet; Take 1 tablet (25 mg total) by mouth daily.  Dispense: 90 tablet; Refill: 0     Otilio Miu, MD

## 2022-01-15 ENCOUNTER — Ambulatory Visit: Payer: Medicaid Other | Admitting: Family Medicine

## 2022-04-14 ENCOUNTER — Ambulatory Visit (INDEPENDENT_AMBULATORY_CARE_PROVIDER_SITE_OTHER): Payer: Self-pay | Admitting: Clinical

## 2022-04-14 DIAGNOSIS — F4310 Post-traumatic stress disorder, unspecified: Secondary | ICD-10-CM

## 2022-04-14 DIAGNOSIS — F410 Panic disorder [episodic paroxysmal anxiety] without agoraphobia: Secondary | ICD-10-CM

## 2022-04-14 DIAGNOSIS — F411 Generalized anxiety disorder: Secondary | ICD-10-CM

## 2022-04-14 DIAGNOSIS — F32A Depression, unspecified: Secondary | ICD-10-CM

## 2022-04-16 ENCOUNTER — Encounter (HOSPITAL_COMMUNITY): Payer: Self-pay
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
## 2022-04-19 ENCOUNTER — Telehealth (HOSPITAL_COMMUNITY): Payer: Self-pay | Admitting: Clinical

## 2022-05-21 ENCOUNTER — Ambulatory Visit (HOSPITAL_COMMUNITY): Payer: Medicaid Other | Admitting: Clinical

## 2022-05-28 ENCOUNTER — Ambulatory Visit (INDEPENDENT_AMBULATORY_CARE_PROVIDER_SITE_OTHER): Payer: Medicaid Other | Admitting: Clinical

## 2022-05-28 ENCOUNTER — Encounter (HOSPITAL_COMMUNITY): Payer: Self-pay | Admitting: Clinical

## 2022-05-28 DIAGNOSIS — F32A Depression, unspecified: Secondary | ICD-10-CM | POA: Diagnosis not present

## 2022-05-28 DIAGNOSIS — F4310 Post-traumatic stress disorder, unspecified: Secondary | ICD-10-CM | POA: Diagnosis not present

## 2022-05-28 DIAGNOSIS — F410 Panic disorder [episodic paroxysmal anxiety] without agoraphobia: Secondary | ICD-10-CM

## 2022-05-28 DIAGNOSIS — F411 Generalized anxiety disorder: Secondary | ICD-10-CM | POA: Diagnosis not present

## 2022-05-28 NOTE — Progress Notes (Signed)
THERAPIST PROGRESS NOTE  Session Time: 9:02am-10:02am  Participation Level: Active  Behavioral Response: Casual and Neat Alert Anxious and Depressed  Type of Therapy: Individual Therapy  Treatment Goals addressed: LTG: [name] will score less than 5 on the Generalized Anxiety Disorder 7 Scale (GAD-7)   STG: [name] will practice problem solving skills 3 times per week for the next 4 weeks.   LTG: Reduce frequency, intensity, and duration of depression symptoms so that daily functioning is improved  STG: Reduce overall depression score by a minimum of 50% on the Patient Health Questionnaire (PHQ-9)  STG: [name] will practice emotion regulation skills 3 time(s) per week for the next 26 week(s)  STG: Report a decrease in PTSD symptoms as evidenced by a 50% reduction in overall score on a clinician administered PTSD assessment screen/scale  STG: [name] will experience a reduction in exaggerated beliefs about self and others that interfere with trauma resolution as evidenced by self-report  STG: Practice interpersonal effectiveness skills 3 times per week for the next 26 weeks  STG: [name] will practice conflict resolution skills at least 1 times per week for the next 26 weeks  STG: [name] will verbalize an increased sense of mastery over PTSD symptoms by using several techniques to cope with flashbacks, decrease the power of triggers, and decrease negative thinking    ProgressTowards Goals: Progressing  Interventions: CBT, Supportive, and Social Skills Training  Summary: [name] is a 37 y.o. female who presents with depression and anxiety, feeling she should be further along in life than she is.  Patient spent this session processing recent discussions between her and the man she has been "talking to" although she stated she was unaware of whether they were in an exclusive relationship until this week when they have been arguing about it.  As we talked about CBT  concepts and thinking patterns, she applied these to her life in general, to her friendships, previous dating situations, and such.  She stated her "friend" is a Herbalist in the mental health field and prides himself on being a good Visual merchandiser.  She read some of the text exchanges between them which showed considerable cognitive distortions on both sides.  These were pointed out and explained in detail, with a Thinking Traps handout given to her (2 copies of each handout provided, 1 for her and 1 for her friend).  She talked at various times throughout the session about how her tough exterior comes from her life experiences and trauma, while her inside is very vulnerable.  The same concepts discussed regarding this female friend were applied when she brought up a friendship that has become somewhat estranged.  Ways to approach either or both of these parties with "I" statements were taught, alternatives discussed.  Because of the reactivity of both relationships (both in patient and in the other party), [name] reviewed [employer], specifically the one about taking a time out in order to gain perspective and prevent negative fallout from displaying an immediate reaction.  She was engaged with good eye contact and was open to learning.  She showed her vulnerability which she stated she is so hesitant to do in her relationships.  [name] pointed out how it benefited her by doing so because [name] was able then to respond truthfully to the true self she was revealing, and how this might be the case with some other people, although it is a risk because it will not be the case with all.  She  was very open to feedback.  Suicidal/Homicidal: No  Therapist Response: Patient is making progress in developing rapport and sharing immediately the problems that she is struggling with in her life.  [name] provided mood monitoring and treatment progress review in the context of this episode of treatment.   Patient  reported that her mood has been "up and down, not bad".   Patient was able to explore how the way she makes assumptions about other people's feelings and meanings behind their words negatively affects her thoughts and thus her feelings, as well as the way their assumptions about her lead to the deterioration of relationships.    [name] gave patient the opportunity to explore thoughts and feelings associated with current life situations and past/present external stressors as desired.   [name] encouraged patient's expression of feelings and validated patient's thoughts, using empathy, active listening, open body language, and unconditional positive regard.  Patient demonstrated an orientation to time, place, person and situation.     Recommendations:  Return to therapy in 2 weeks, engage in self care behaviors, share Thinking Traps with friend, consider these for herself and try to examine her thoughts until next session from this lens, try to use "I" statements in working through issues with these two friends, if the friendships are going to continue, suggest at least adopting the [employer] of being able to walk away to calm down  Plan: Return again in 2 weeks.  Diagnosis: Depression, unspecified depression type  Post traumatic stress disorder  Generalized anxiety disorder with panic attacks  Collaboration of Care: Other - none needed currently  Patient/Guardian was advised Release of Information must be obtained prior to any record release in order to collaborate their care with an outside provider. Patient/Guardian was advised if they have not already done so to contact the registration department to sign all necessary forms in order for [place] to release information regarding their care.   Consent: Patient/Guardian gives verbal consent for treatment and assignment of benefits for services provided during this visit. Patient/Guardian expressed understanding and agreed to proceed.   [name], LCSW 05/28/2022

## 2022-06-04 ENCOUNTER — Encounter (HOSPITAL_COMMUNITY): Payer: Self-pay | Admitting: Clinical

## 2022-06-04 ENCOUNTER — Ambulatory Visit (INDEPENDENT_AMBULATORY_CARE_PROVIDER_SITE_OTHER): Payer: Medicaid Other | Admitting: Clinical

## 2022-06-04 DIAGNOSIS — F410 Panic disorder [episodic paroxysmal anxiety] without agoraphobia: Secondary | ICD-10-CM

## 2022-06-04 DIAGNOSIS — F411 Generalized anxiety disorder: Secondary | ICD-10-CM | POA: Diagnosis not present

## 2022-06-04 DIAGNOSIS — F32A Depression, unspecified: Secondary | ICD-10-CM | POA: Diagnosis not present

## 2022-06-04 DIAGNOSIS — F4310 Post-traumatic stress disorder, unspecified: Secondary | ICD-10-CM
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
## 2022-06-11 ENCOUNTER — Ambulatory Visit (HOSPITAL_COMMUNITY): Payer: Medicaid Other | Admitting: Clinical

## 2022-06-23 DIAGNOSIS — G932 Benign intracranial hypertension: Secondary | ICD-10-CM | POA: Insufficient documentation

## 2022-07-02 ENCOUNTER — Ambulatory Visit (INDEPENDENT_AMBULATORY_CARE_PROVIDER_SITE_OTHER): Payer: Medicaid Other | Admitting: Clinical

## 2022-07-02 DIAGNOSIS — F32A Depression, unspecified: Secondary | ICD-10-CM

## 2022-07-02 DIAGNOSIS — F4310 Post-traumatic stress disorder, unspecified: Secondary | ICD-10-CM

## 2022-07-02 DIAGNOSIS — F411 Generalized anxiety disorder: Secondary | ICD-10-CM

## 2022-07-02 DIAGNOSIS — F410 Panic disorder [episodic paroxysmal anxiety] without agoraphobia: Secondary | ICD-10-CM | POA: Diagnosis not present

## 2022-07-04 ENCOUNTER — Encounter (HOSPITAL_COMMUNITY): Payer: Self-pay | Admitting: Clinical
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
## 2022-07-09 ENCOUNTER — Ambulatory Visit (INDEPENDENT_AMBULATORY_CARE_PROVIDER_SITE_OTHER): Payer: Medicaid Other | Admitting: Clinical

## 2022-07-09 ENCOUNTER — Encounter (HOSPITAL_COMMUNITY): Payer: Self-pay | Admitting: Clinical

## 2022-07-09 DIAGNOSIS — F32A Depression, unspecified: Secondary | ICD-10-CM | POA: Diagnosis not present

## 2022-07-09 DIAGNOSIS — F4310 Post-traumatic stress disorder, unspecified: Secondary | ICD-10-CM | POA: Diagnosis not present

## 2022-07-09 DIAGNOSIS — F411 Generalized anxiety disorder: Secondary | ICD-10-CM | POA: Diagnosis not present

## 2022-07-09 DIAGNOSIS — F410 Panic disorder [episodic paroxysmal anxiety] without agoraphobia: Secondary | ICD-10-CM | POA: Diagnosis not present
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
## 2022-07-16 ENCOUNTER — Encounter (HOSPITAL_COMMUNITY): Payer: Self-pay | Admitting: Clinical

## 2022-07-16 ENCOUNTER — Ambulatory Visit (INDEPENDENT_AMBULATORY_CARE_PROVIDER_SITE_OTHER): Payer: Medicaid Other | Admitting: Clinical

## 2022-07-16 DIAGNOSIS — F410 Panic disorder [episodic paroxysmal anxiety] without agoraphobia: Secondary | ICD-10-CM | POA: Diagnosis not present

## 2022-07-16 DIAGNOSIS — F32A Depression, unspecified: Secondary | ICD-10-CM

## 2022-07-16 DIAGNOSIS — F4310 Post-traumatic stress disorder, unspecified: Secondary | ICD-10-CM

## 2022-07-16 DIAGNOSIS — F411 Generalized anxiety disorder: Secondary | ICD-10-CM
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
## 2022-07-23 ENCOUNTER — Encounter (HOSPITAL_COMMUNITY): Payer: Self-pay | Admitting: Clinical

## 2022-07-23 ENCOUNTER — Ambulatory Visit (INDEPENDENT_AMBULATORY_CARE_PROVIDER_SITE_OTHER): Payer: Medicaid Other | Admitting: Clinical

## 2022-07-23 DIAGNOSIS — F32A Depression, unspecified: Secondary | ICD-10-CM | POA: Diagnosis not present

## 2022-07-23 DIAGNOSIS — F419 Anxiety disorder, unspecified: Secondary | ICD-10-CM | POA: Diagnosis not present

## 2022-07-23 DIAGNOSIS — F411 Generalized anxiety disorder: Secondary | ICD-10-CM | POA: Diagnosis not present

## 2022-07-23 DIAGNOSIS — F4310 Post-traumatic stress disorder, unspecified: Secondary | ICD-10-CM | POA: Diagnosis not present

## 2022-07-23 DIAGNOSIS — F410 Panic disorder [episodic paroxysmal anxiety] without agoraphobia: Secondary | ICD-10-CM

## 2022-07-23 NOTE — Progress Notes (Unsigned)
THERAPIST PROGRESS NOTE  Session Time: 9:00-10:00am  Session #7  Participation Level: Active  Behavioral Response: Casual and Neat Alert Euthymic  Type of Therapy: Individual Therapy  Treatment Goals addressed: LTG: [name] will score less than 5 on the Generalized Anxiety Disorder 7 Scale (GAD-7)   STG: [name] will practice problem solving skills 3 times per week for the next 4 weeks.   LTG: Reduce frequency, intensity, and duration of depression symptoms so that daily functioning is improved  STG: Reduce overall depression score by a minimum of 50% on the Patient Health Questionnaire (PHQ-9)  STG: [name] will practice emotion regulation skills 3 time(s) per week for the next 26 week(s)  STG: Report a decrease in PTSD symptoms as evidenced by a 50% reduction in overall score on a clinician administered PTSD assessment screen/scale  STG: [name] will experience a reduction in exaggerated beliefs about self and others that interfere with trauma resolution as evidenced by self-report  STG: Practice interpersonal effectiveness skills 3 times per week for the next 26 weeks  STG: [name] will practice conflict resolution skills at least 1 times per week for the next 26 weeks  STG: [name] will verbalize an increased sense of mastery over PTSD symptoms by using several techniques to cope with flashbacks, decrease the power of triggers, and decrease negative thinking   ProgressTowards Goals: Progressing  Interventions: CBT, Assertiveness Training, and Supportive  Summary: [name] is a 37 y.o. female who presents with depression and anxiety.  She reported that she is doing good and is sleeping "okay" although she is having nighttime issues with being too warm.  Her appetite is down but she does eat two full meals a day.  She is now taking an injection aimed to help her lose weight because her neurologist stated this will help reduce the fluid in her brain and thus  will reduce her headaches.  The injection does make her sleepy.  She did weigh 184lb at the beginning of this journey, and they would like her to weight 144.  She prefers to only lose down to about 150-160 pounds.  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  [name] provided her with ideas on verbiage regarding some of the things she does not like, such as people being judgmental about Medicaid holders.  This is the first time she can remember not being treated as an equal, processed with her that sometimes doctors can give off that type of vibe and that this is not likely to last time she will endure this.  Advised her on coping methods.   She will graduate next month and is looking forward to it, feels it cannot come soon enough.  However, she is also considering going back to school to pursue nursing.  She has also wanted to be a motivational speaker.  She stated once she has accomplished one thing, there is always something else that takes its place.  We discussed how things are going with her son being with his father in [place].  She has seen him a couple of times.  The man she was seeing who is her son's basketball coach has still been exchanging texts with her, but she considers the romantic relationship to be over.  There is an older gentleman (in 7s) who has been pursuing her romantically and sexually, and she shared quite a bit about her experiences with him, was encouraged by [name] to be  okay with saying "no" to people who are not offering something to her life that she desires.  She is not interested in romance at this time in her life, was encouraged to be honest with herself and others about this.  Suicidal/Homicidal: No  Therapist Response: Patient is progressing AEB engaging in scheduled therapy session.  She presented oriented x5 and stated she was feeling "good."  [name] evaluated patient's medication compliance and self-care since last  session.  [name] reviewed the last session with patient who reported her neurologist wants her to lose weight in order to reduce her brain fluids and headaches.  We processed the various concerns that are presenting to her at this time,.  Throughout the session, [name] gave patient the opportunity to explore thoughts and feelings associated with current life situations and past/present external stressors.   [name] encouraged patient's expression of feelings and validated patient's thoughts using empathy, active listening, open body language, and unconditional positive regard.        Recommendations:  Return to therapy in 1 week, engage in self care behaviors  Plan: Return again in 1 week Next appointment 6/21  Diagnosis:  Post traumatic stress disorder  Depression, unspecified depression type  Generalized anxiety disorder with panic attacks  Anxiety disorder, unspecified type  Collaboration of Care: Other - none needed currently  Patient/Guardian was advised Release of Information must be obtained prior to any record release in order to collaborate their care with an outside provider. Patient/Guardian was advised if they have not already done so to contact the registration department to sign all necessary forms in order for [place] to release information regarding their care.   Consent: Patient/Guardian gives verbal consent for treatment and assignment of benefits for services provided during this visit. Patient/Guardian expressed understanding and agreed to proceed.   [name], LCSW 07/23/2022

## 2022-08-13 ENCOUNTER — Ambulatory Visit (INDEPENDENT_AMBULATORY_CARE_PROVIDER_SITE_OTHER): Payer: Medicaid Other | Admitting: Clinical

## 2022-08-13 ENCOUNTER — Encounter (HOSPITAL_COMMUNITY): Payer: Self-pay | Admitting: Clinical

## 2022-08-13 DIAGNOSIS — F411 Generalized anxiety disorder: Secondary | ICD-10-CM | POA: Diagnosis not present

## 2022-08-13 DIAGNOSIS — F32A Depression, unspecified: Secondary | ICD-10-CM | POA: Diagnosis not present

## 2022-08-13 DIAGNOSIS — F410 Panic disorder [episodic paroxysmal anxiety] without agoraphobia: Secondary | ICD-10-CM

## 2022-08-13 DIAGNOSIS — F4310 Post-traumatic stress disorder, unspecified: Secondary | ICD-10-CM | POA: Diagnosis not present

## 2022-08-13 NOTE — Progress Notes (Signed)
THERAPIST PROGRESS NOTE  Session Time: 9:00am-10:00am  Session #8  Participation Level: Active  Behavioral Response: Casual and Neat Alert Depressed and Tearful    Type of Therapy: Individual Therapy  Treatment Goals addressed: LTG: [name] will score less than 5 on the Generalized Anxiety Disorder 7 Scale (GAD-7)   STG: [name] will practice problem solving skills 3 times per week for the next 4 weeks.   LTG: Reduce frequency, intensity, and duration of depression symptoms so that daily functioning is improved  STG: Reduce overall depression score by a minimum of 50% on the Patient Health Questionnaire (PHQ-9)  STG: [name] will practice emotion regulation skills 3 time(s) per week for the next 26 week(s)  STG: Report a decrease in PTSD symptoms as evidenced by a 50% reduction in overall score on a clinician administered PTSD assessment screen/scale  STG: [name] will experience a reduction in exaggerated beliefs about self and others that interfere with trauma resolution as evidenced by self-report  STG: Practice interpersonal effectiveness skills 3 times per week for the next 26 weeks  STG: [name] will practice conflict resolution skills at least 1 times per week for the next 26 weeks  STG: [name] will verbalize an increased sense of mastery over PTSD symptoms by using several techniques to cope with flashbacks, decrease the power of triggers, and decrease negative thinking   ProgressTowards Goals: Progressing  Interventions: CBT, Supportive, and Other: shame work    Summary: [name] is a 37 y.o. female who presents with depression and anxiety.  She stated immediately that she has been experiencing worsening symptoms of depression, with crying bouts, sleeping a lot, being easily annoyed, not going out to do anything, and staying in bed.  [name] mentioned the possibility that she needs to be on medication and discussed that there are different types.  She is  reluctant because in the past medication has made her unfeeling.  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  Something inside her is "screaming" to move, and she admits that she has been called a "runner" because when she just does not want to deal with things any longer, she leaves.  She does not want a connection to her old life and the things she used to do, feels like she needs to shed a skin and be a better example to her children.  She reports that she keeps all her emotions bottled up and presents with a "happy mask."  As [name] asked probing questions with concern regarding her presenting shame, she stated that she never feels she is good enough or does enough.  She feels stuck in survival mode and states she does not think she will ever live a regular life.  [name] asked about what she thinks is "regular," and she stated that she just wants to be somewhat close to "normal."  She described what she views as normal and was able to see that many of the stories of people's lives that she sees on social media are not truthful.  We talked about normal being different from person to person.  We also discussed  going on a social media diet, as well as specifics of what that might look like.  She stated one thing she could do to start feeling better is to start living by a schedule again.  This was explored and encouraged.  Shame work was done, mostly in the form of education at this point.  She was provided a handout in which she is asked to answer questions about 12 areas of shame, one asking how she wants to be perceived in each of those areas and the other asking how she does NOT want to be  perceived in those areas.  She answered several including that she wants to be perceived as healthy, not sick and hard-working, not lazy.  She agreed that we continue to work on shame in upcoming sessions.  Suicidal/Homicidal: No  Therapist Response: Patient is progressing AEB engaging in scheduled therapy session.  She presented oriented x5 and stated she was feeling "weird, not good, crying."  She cried often throughout the session.  [name] evaluated patient's self-care since last session.  Patient stated she has been staying in bed when not working, which makes her feel guilty because she feels she should be doing things with her kids.  [name] taught 1-2 basics about shame being universal and usually silent, then asked questions designed to get at shame areas.  Patient responded to these willingly and could discuss them in a way to indicate good comprehension.  [name] assigned patient the task of answering additional questions about basic areas of shame prior to next session on 7/12.    Throughout the session, [name] gave patient the opportunity to explore thoughts and feelings associated with current life situations and past/present external stressors.   [name] encouraged patient's expression of feelings and validated patient's thoughts using empathy, active listening, open body language, and unconditional positive regard.      Recommendations:  Return to therapy in 1 week, engage in self care behaviors  Plan: Return again in 1 week Next appointment 7/12  Diagnosis:  Post traumatic stress disorder  Depression, unspecified depression type  Generalized anxiety disorder with panic attacks  Collaboration of Care: Other - none needed currently  Patient/Guardian was advised Release of Information must be obtained prior to any record release in order to collaborate their care with an outside provider. Patient/Guardian was advised if they have not already done so to contact the registration department to sign all  necessary forms in order for [place] to release information regarding their care.   Consent: Patient/Guardian gives verbal consent for treatment and assignment of benefits for services provided during this visit. Patient/Guardian expressed understanding and agreed to proceed.   [name], LCSW 08/13/2022

## 2022-08-20 ENCOUNTER — Ambulatory Visit (HOSPITAL_COMMUNITY): Payer: Medicaid Other | Admitting: Clinical

## 2022-08-27 ENCOUNTER — Ambulatory Visit (HOSPITAL_COMMUNITY): Payer: Medicaid Other | Admitting: Clinical

## 2022-09-03 ENCOUNTER — Ambulatory Visit (HOSPITAL_COMMUNITY): Payer: Medicaid Other | Admitting: Clinical

## 2022-09-03 ENCOUNTER — Encounter (HOSPITAL_COMMUNITY): Payer: Self-pay | Admitting: Clinical

## 2022-09-03 DIAGNOSIS — F410 Panic disorder [episodic paroxysmal anxiety] without agoraphobia: Secondary | ICD-10-CM | POA: Diagnosis not present

## 2022-09-03 DIAGNOSIS — F32A Depression, unspecified: Secondary | ICD-10-CM

## 2022-09-03 DIAGNOSIS — F4310 Post-traumatic stress disorder, unspecified: Secondary | ICD-10-CM

## 2022-09-03 DIAGNOSIS — F411 Generalized anxiety disorder: Secondary | ICD-10-CM

## 2022-09-03 NOTE — Progress Notes (Unsigned)
THERAPIST PROGRESS NOTE  Session Time: 9:05-10:00am  Session #9  Participation Level: Active  Behavioral Response: Casual and Neat Alert Euthymic and Excited    Type of Therapy: Individual Therapy  Treatment Goals addressed: LTG: [name] will score less than 5 on the Generalized Anxiety Disorder 7 Scale (GAD-7)   STG: [name] will practice problem solving skills 3 times per week for the next 4 weeks.   LTG: Reduce frequency, intensity, and duration of depression symptoms so that daily functioning is improved  STG: Reduce overall depression score by a minimum of 50% on the Patient Health Questionnaire (PHQ-9)  STG: [name] will practice emotion regulation skills 3 time(s) per week for the next 26 week(s)  STG: Report a decrease in PTSD symptoms as evidenced by a 50% reduction in overall score on a clinician administered PTSD assessment screen/scale  STG: [name] will experience a reduction in exaggerated beliefs about self and others that interfere with trauma resolution as evidenced by self-report  STG: Practice interpersonal effectiveness skills 3 times per week for the next 26 weeks  STG: [name] will practice conflict resolution skills at least 1 times per week for the next 26 weeks  STG: [name] will verbalize an increased sense of mastery over PTSD symptoms by using several techniques to cope with flashbacks, decrease the power of triggers, and decrease negative thinking   ProgressTowards Goals: Progressing  Interventions: Supportive, Psychologist, occupational, and Other: shame work    Summary: [name] is a 37 y.o. female who presents with depression and anxiety.  She was excited and not tearful as she has been in recent sessions.  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  We processed staying in the moment and enjoying the accomplishment, since her tendency is to think forward to the future, mentioning going on to nursing school.   Her children immediately begged her not to do that.  She was very focused on concerns about her mother who has questionable judgment in dating and is in a current relationship with a multitude of red flags.  If her mother does in fact move to live with this man in [place], the patient is pretty sure she will move to [place] to be closer to mother.  We talked the rest of the session about setting boundaries and telling the other person so they have the right to make a decision about whether to comply with that boundary.  She stated she is feeling considerably less shame after graduating, as that makes her feel that she is worthy at least in having persisted with her education.  Patient stated she used to do things with her son's father for "favors" and no longer wants to do that.  Part of her would like to talk to his wife directly to try to clear the air and have a better co-parenting relationship, but the ex would not like it.  [name] talked with her about some of the basic rules for good co-parenting, including never talking negatively about any of the other parents including steps.  More such hints were provided.  We talked at length about ways she might approach suggesting a collaboration such as this to her ex and his wife.  Suicidal/Homicidal: No  Therapist Response:  Patient is progressing AEB engaging in scheduled therapy session.  She presented oriented x5 and stated she was feeling "relieved and better."  [name] evaluated patient's medication compliance and self-care since last session.  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  Patient stated she has had a first interview with a second lined up at the same place, and a first somewhere else.  [name] encouraged patient to schedule more therapy sessions for the future, as she only has 2 more scheduled, ending in early August.  Throughout the  session, [name] gave patient the opportunity to explore thoughts and feelings associated with current life situations and past/present external stressors.   [name] encouraged patient's expression of feelings and validated patient's thoughts using empathy, active listening, open body language, and unconditional positive regard.        Recommendations:  Return to therapy in 1 week, engage in self care behaviors  Plan: Return again in 1 week Next appointment 8/2  Diagnosis:  Post traumatic stress disorder  Depression, unspecified depression type  Generalized anxiety disorder with panic attacks  Collaboration of Care: Other - none needed currently  Patient/Guardian was advised Release of Information must be obtained prior to any record release in order to collaborate their care with an outside provider. Patient/Guardian was advised if they have not already done so to contact the registration department to sign all necessary forms in order for [place] to release information regarding their care.   Consent: Patient/Guardian gives verbal consent for treatment and assignment of benefits for services provided during this visit. Patient/Guardian expressed understanding and agreed to proceed.   [name], LCSW 09/03/2022

## 2022-09-10 ENCOUNTER — Encounter (HOSPITAL_COMMUNITY): Payer: Self-pay | Admitting: Clinical

## 2022-09-10 ENCOUNTER — Ambulatory Visit (HOSPITAL_COMMUNITY): Payer: Medicaid Other | Admitting: Clinical

## 2022-09-10 DIAGNOSIS — F4310 Post-traumatic stress disorder, unspecified: Secondary | ICD-10-CM

## 2022-09-10 DIAGNOSIS — F32A Depression, unspecified: Secondary | ICD-10-CM | POA: Diagnosis not present

## 2022-09-10 DIAGNOSIS — F410 Panic disorder [episodic paroxysmal anxiety] without agoraphobia: Secondary | ICD-10-CM | POA: Diagnosis not present

## 2022-09-10 DIAGNOSIS — F411 Generalized anxiety disorder: Secondary | ICD-10-CM

## 2022-09-10 NOTE — Progress Notes (Unsigned)
THERAPIST PROGRESS NOTE  Session Time: 9:14-10:00am  Session #910 Participation Level: Active  Behavioral Response: Casual and Neat Alert Euthymic and Excited    Type of Therapy: Individual Therapy  Treatment Goals addressed: LTG: [name] will score less than 5 on the Generalized Anxiety Disorder 7 Scale (GAD-7)   STG: [name] will practice problem solving skills 3 times per week for the next 4 weeks.   LTG: Reduce frequency, intensity, and duration of depression symptoms so that daily functioning is improved  STG: Reduce overall depression score by a minimum of 50% on the Patient Health Questionnaire (PHQ-9)  STG: [name] will practice emotion regulation skills 3 time(s) per week for the next 26 week(s)  STG: Report a decrease in PTSD symptoms as evidenced by a 50% reduction in overall score on a clinician administered PTSD assessment screen/scale  STG: [name] will experience a reduction in exaggerated beliefs about self and others that interfere with trauma resolution as evidenced by self-report  STG: Practice interpersonal effectiveness skills 3 times per week for the next 26 weeks  STG: [name] will practice conflict resolution skills at least 1 times per week for the next 26 weeks  STG: [name] will verbalize an increased sense of mastery over PTSD symptoms by using several techniques to cope with flashbacks, decrease the power of triggers, and decrease negative thinking   ProgressTowards Goals: Progressing  Interventions: Assertiveness Training and Supportive   Summary: [name] is a 37 y.o. female who presents with depression and anxiety.  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  She has accepted an offer and will start at the end of the month at [employer].  She did get to shadow for a few hours and that contributed to her decision, as she felt the atmosphere was welcoming.  She talked at length  about how this move forward in her life has made her feel proud of herself rather than "the wreck I used to be."  [name] suggested to her that she do some journaling about her feelings so that she can check back at a later time when things maybe are not going so well to remind herself of today's feelings.  [name] emphasized that the opposite also works, that she can journal about her bad days and then on other bad days, remind herself of how far she has actually come.  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  She is very much looking forward to making a regular paycheck and "being able to do at Christmas for my kids what I haven't been able to do before."  We talked for the remainder of the session about her relationship with her ex, her son's father, who appears to be obsessed with her.  She has removed herself emotionally at times to look at the situation and almost clinically observes his behaviors but cannot understand the reasons behind it.  She wants him to be self-aware, so [name] pointed out that this is not her job to achieve,.  [name] suggested to her that she try silence in response to his obsessive, derogatory statements.  This was processed for the remainder of the session with different examples of responses provided.  Patient has also started to a former friend from college and we talked about her advancing slowly in that relationship  with boundaries in place.  She was happy to report that her mother established boundaries with her new boyfriend and left him because of his alcoholism.  Suicidal/Homicidal: No  Therapist Response: Patient is progressing AEB engaging in scheduled therapy session.  She presented oriented x5 and stated she was feeling "on a high."  [name] evaluated patient's medication compliance and self-care since last session.  [name] reviewed the last session with patient who  reported she is in a much better place now.  Patient stated she feels her life is starting to make sense and she feels like a grown-up now.  [name] reflected back to the patient as she brought up various thoughts, feelings, and incidents so that she could evaluate what she was saying.  [name] encouraged patient to schedule more therapy sessions for the future, as only one more remains at this time  Throughout the session, [name] gave patient the opportunity to explore thoughts and feelings associated with current life situations and past/present external stressors.   [name] encouraged patient's expression of feelings and validated patient's thoughts using empathy, active listening, open body language, and unconditional positive regard.      Recommendations:  Return to therapy in 1 week, engage in self care behaviors, try using silence as a response to her ex, think about what boundaries are needed in her life  Plan: Return again in 1 week Next appointment 8/9  Diagnosis:  Generalized anxiety disorder with panic attacks  Post traumatic stress disorder  Depression, unspecified depression type  Collaboration of Care: Other - none needed currently  Patient/Guardian was advised Release of Information must be obtained prior to any record release in order to collaborate their care with an outside provider. Patient/Guardian was advised if they have not already done so to contact the registration department to sign all necessary forms in order for [place] to release information regarding their care.   Consent: Patient/Guardian gives verbal consent for treatment and assignment of benefits for services provided during this visit. Patient/Guardian expressed understanding and agreed to proceed.   [name], LCSW 09/12/2022

## 2022-09-17 ENCOUNTER — Encounter (HOSPITAL_COMMUNITY): Payer: Self-pay | Admitting: Clinical

## 2022-09-17 ENCOUNTER — Ambulatory Visit (HOSPITAL_COMMUNITY): Payer: Medicaid Other | Admitting: Clinical

## 2022-09-17 DIAGNOSIS — F32A Depression, unspecified: Secondary | ICD-10-CM | POA: Diagnosis not present

## 2022-09-17 DIAGNOSIS — F410 Panic disorder [episodic paroxysmal anxiety] without agoraphobia: Secondary | ICD-10-CM

## 2022-09-17 DIAGNOSIS — F4310 Post-traumatic stress disorder, unspecified: Secondary | ICD-10-CM | POA: Diagnosis not present

## 2022-09-17 DIAGNOSIS — F411 Generalized anxiety disorder: Secondary | ICD-10-CM

## 2022-09-17 NOTE — Progress Notes (Unsigned)
   THERAPIST PROGRESS NOTE  Session Time: 9:00-10:00am  Session #11  Participation Level: Active  Behavioral Response: Casual and Neat Alert Euthymic and Excited    Type of Therapy: Individual Therapy  Treatment Goals addressed: LTG: Marka will score less than 5 on the Generalized Anxiety Disorder 7 Scale (GAD-7)   STG: Jacayla will practice problem solving skills 3 times per week for the next 4 weeks.   LTG: Reduce frequency, intensity, and duration of depression symptoms so that daily functioning is improved  STG: Reduce overall depression score by a minimum of 50% on the Patient Health Questionnaire (PHQ-9)  STG: Emilymarie will practice emotion regulation skills 3 time(s) per week for the next 26 week(s)  STG: Report a decrease in PTSD symptoms as evidenced by a 50% reduction in overall score on a clinician administered PTSD assessment screen/scale  STG: Sheronica will experience a reduction in exaggerated beliefs about self and others that interfere with trauma resolution as evidenced by self-report  STG: Practice interpersonal effectiveness skills 3 times per week for the next 26 weeks  STG: Alyx will practice conflict resolution skills at least 1 times per week for the next 26 weeks  STG: Jamielee will verbalize an increased sense of mastery over PTSD symptoms by using several techniques to cope with flashbacks, decrease the power of triggers, and decrease negative thinking   ProgressTowards Goals: Progressing  Interventions: CBT and Supportive   Summary: [name] is a 37 y.o. female who presents with depression and anxiety.  ***  Suicidal/Homicidal: No  Therapist Response:  Patient is progressing AEB engaging in scheduled therapy session.  She presented oriented x5 and stated she was feeling "good until yesterday."  [name] evaluated patient's medication compliance and self-care since last session.  [name] reviewed the last session with patient who reported  ***.  Patient stated ***.  [name] taught CBT-related coping skills, specifically *** and ***.  Patient received these willingly and could discuss them in a way to indicate good comprehension.  [name] assigned patient the task of trying out these new coping skills (types of boundaries - 3 handouts) prior to next session on ***.  [name] encouraged patient to schedule more therapy sessions for the future, as ***  Throughout the session, [name] gave patient the opportunity to explore thoughts and feelings associated with current life situations and past/present external stressors.   [name] encouraged patient's expression of feelings and validated patient's thoughts using empathy, active listening, open body language, and unconditional positive regard.     Recommendations:  Return to therapy in 1 week, engage in self care behaviors, think about what boundaries are needed in her life using 3 worksheets given, complete Wheel of Life worksheet  Plan: Return again in 1 week  Next appointment 9/18  Diagnosis:  Depression, unspecified depression type  Generalized anxiety disorder with panic attacks  Post traumatic stress disorder  Collaboration of Care: Other - none needed currently  Patient/Guardian was advised Release of Information must be obtained prior to any record release in order to collaborate their care with an outside provider. Patient/Guardian was advised if they have not already done so to contact the registration department to sign all necessary forms in order for [place] to release information regarding their care.   Consent: Patient/Guardian gives verbal consent for treatment and assignment of benefits for services provided during this visit. Patient/Guardian expressed understanding and agreed to proceed.   [name], LCSW 09/17/2022

## 2022-10-06 ENCOUNTER — Telehealth: Payer: Medicaid Other | Admitting: Family Medicine

## 2022-10-06 DIAGNOSIS — H1031 Unspecified acute conjunctivitis, right eye: Secondary | ICD-10-CM | POA: Diagnosis not present

## 2022-10-06 MED ORDER — POLYMYXIN B-TRIMETHOPRIM 10000-0.1 UNIT/ML-% OP SOLN: 1.0000 [drp] | Freq: Four times a day (QID) | OPHTHALMIC | 0 refills | Status: DC

## 2022-10-06 NOTE — Patient Instructions (Addendum)
[name], thank you for joining [name], NP for today's virtual visit.  While this provider is not your primary care provider (PCP), if your PCP is located in our provider database this encounter information will be shared with them immediately following your visit.   [name] account gives you access to today's visit and all your visits, tests, and labs performed at [name] " click here if you don't have a [name] account or go to [email or web address]  Consent: (Patient) [name] provided verbal consent for this virtual visit at the beginning of the encounter.  Current Medications:  Current Outpatient Medications:    trimethoprim-polymyxin b (POLYTRIM) ophthalmic solution, Place 1 drop into the right eye in the morning, at noon, in the evening, and at bedtime., Disp: 10 mL, Rfl: 0   albuterol (PROVENTIL HFA;VENTOLIN HFA) 108 (90 BASE) MCG/ACT inhaler, Inhale 2 puffs into the lungs every 6 (six) hours as needed for wheezing or shortness of breath., Disp: , Rfl:    chlorthalidone (HYGROTON) 25 MG tablet, Take 1 tablet (25 mg total) by mouth daily., Disp: 90 tablet, Rfl: 0   D3-50 1.25 MG (50000 UT) capsule, Take 50,000 Units by mouth once a week. gyn, Disp: , Rfl:    ibuprofen (ADVIL) 600 MG tablet, Take 1 tablet (600 mg total) by mouth every 8 (eight) hours as needed., Disp: 30 tablet, Rfl: 0   topiramate (TOPAMAX) 25 MG tablet, Take 25 mg by mouth daily. neuro, Disp: , Rfl:    Medications ordered in this encounter:  Meds ordered this encounter  Medications   trimethoprim-polymyxin b (POLYTRIM) ophthalmic solution    Sig: Place 1 drop into the right eye in the morning, at noon, in the evening, and at bedtime.    Dispense:  10 mL    Refill:  0    Order Specific Question:   Supervising Provider    Answer:   [name] [[id number]]     *If you need refills on other medications prior to your next appointment,  please contact your pharmacy*  Follow-Up: Call back or seek an in-person evaluation if the symptoms worsen or if the condition fails to improve as anticipated.  [name] [phone]  Other Instructions Bacterial Conjunctivitis, Adult Bacterial conjunctivitis is an infection of your conjunctiva. This is the clear membrane that covers the white part of your eye and the inner part of your eyelid. This infection can make your eye: Red or pink. Itchy or irritated. This condition spreads easily from person to person (is contagious) and from one eye to the other eye. What are the causes? This condition is caused by germs (bacteria). You may get the infection if you come into close contact with: A person who has the infection. Items that have germs on them (are contaminated), such as face towels, contact lens solution, or eye makeup. What increases the risk? You are more likely to get this condition if: You have contact with people who have the infection. You wear contact lenses. You have a sinus infection. You have had a recent eye injury or surgery. You have a weak body defense system (immune system). You have dry eyes. What are the signs or symptoms?  Thick, yellowish discharge from the eye. Tearing or watery eyes. Itchy eyes. Burning feeling in your eyes. Eye redness. Swollen eyelids. Blurred vision. How is this treated?  Antibiotic eye drops or ointment. Antibiotic medicine taken by mouth. This is  used for infections that do not get better with drops or ointment or that last more than 10 days. Cool, wet cloths placed on the eyes. Artificial tears used 2-6 times a day. Follow these instructions at home: Medicines Take or apply your antibiotic medicine as told by your doctor. Do not stop using it even if you start to feel better. Take or apply over-the-counter and prescription medicines only as told by your doctor. Do not touch your eyelid with the eye-drop  bottle or the ointment tube. Managing discomfort Wipe any fluid from your eye with a warm, wet washcloth or a cotton ball. Place a clean, cool, wet cloth on your eye. Do this for 10-20 minutes, 3-4 times a day. General instructions Do not wear contacts until the infection is gone. Wear glasses until your doctor says it is okay to wear contacts again. Do not wear eye makeup until the infection is gone. Throw away old eye makeup. Change or wash your pillowcase every day. Do not share towels or washcloths. Wash your hands often with soap and water for at least 20 seconds and especially before touching your face or eyes. Use paper towels to dry your hands. Do not touch or rub your eyes. Do not drive or use heavy machinery if your vision is blurred. Contact a doctor if: You have a fever. You do not get better after 10 days. Get help right away if: You have a fever and your symptoms get worse all of a sudden. You have very bad pain when you move your eye. Your face: Hurts. Is red. Is swollen. You have sudden loss of vision. Summary Bacterial conjunctivitis is an infection of your conjunctiva. This infection spreads easily from person to person. Wash your hands often with soap and water for at least 20 seconds and especially before touching your face or eyes. Use paper towels to dry your hands. Take or apply your antibiotic medicine as told by your doctor. Contact a doctor if you have a fever or you do not get better after 10 days. This information is not intended to replace advice given to you by your health care provider. Make sure you discuss any questions you have with your health care provider. Document Revised: 05/07/2020 Document Reviewed: 05/07/2020 Elsevier Patient Education  2024 Elsevier Inc.    If you have been instructed to have an in-person evaluation today at a local Urgent Care facility, please use the link below. It will take you to a list of all of our available [facility], including address, phone number and hours of operation. Please do not delay care.  [facility]  If you or a family member do not have a primary care provider, use the link below to schedule a visit and establish care. When you choose a [facility] primary care physician or advanced practice provider, you gain a long-term partner in health. Find a Primary Care Provider  Learn more about [facility] in-office and virtual care options:  - Get Care Now

## 2022-10-06 NOTE — Progress Notes (Signed)
Virtual Visit Consent   [name], you are scheduled for a virtual visit with a [facility] provider today. Just as with appointments in the office, your consent must be obtained to participate. Your consent will be active for this visit and any virtual visit you may have with one of our providers in the next 365 days. If you have a MyChart account, a copy of this consent can be sent to you electronically.  As this is a virtual visit, video technology does not allow for your provider to perform a traditional examination. This may limit your provider's ability to fully assess your condition. If your provider identifies any concerns that need to be evaluated in person or the need to arrange testing (such as labs, EKG, etc.), we will make arrangements to do so. Although advances in technology are sophisticated, we cannot ensure that it will always work on either your end or our end. If the connection with a video visit is poor, the visit may have to be switched to a telephone visit. With either a video or telephone visit, we are not always able to ensure that we have a secure connection.  By engaging in this virtual visit, you consent to the provision of healthcare and authorize for your insurance to be billed (if applicable) for the services provided during this visit. Depending on your insurance coverage, you may receive a charge related to this service.  I need to obtain your verbal consent now. Are you willing to proceed with your visit today? [name] has provided verbal consent on 10/06/2022 for a virtual visit (video or telephone). [name], NP  Date: 10/06/2022 2:19 PM  Virtual Visit via Video Note   I, [name], connected with  [name]  ([id number], 1985-10-16) on 10/06/22 at  2:15 PM EDT by a video-enabled telemedicine application and verified that I am speaking with the correct person using two identifiers.  Location: Patient: Virtual Visit Location  Patient: Home Provider: Virtual Visit Location Provider: Home Office   I discussed the limitations of evaluation and management by telemedicine and the availability of in person appointments. The patient expressed understanding and agreed to proceed.    History of Present Illness: [name] is a 37 y.o. who identifies as a female who was assigned female at birth, and is being seen today for right eye redness  Onset was this morning- with yellow drainage in the corner of eye and keeps coming back  Associated symptoms are redness, gritty sensation  Modifying factors are keeping it clean Denies chest pain, shortness of breath, fevers, chills, URI symptoms  Eye pain with movement and or vision changes, swelling around the eye  Exposure to sick contacts- unknown COVID test: none  Problems:  Patient Active Problem List   Diagnosis Date Noted   Normal labor and delivery 01/10/2015   SVD (spontaneous vaginal delivery) 01/10/2015   Normal pregnancy, repeat 01/10/2015   Normal pregnancy in multigravida in third trimester 01/09/2015    Allergies: No Known Allergies Medications:  Current Outpatient Medications:    albuterol (PROVENTIL HFA;VENTOLIN HFA) 108 (90 BASE) MCG/ACT inhaler, Inhale 2 puffs into the lungs every 6 (six) hours as needed for wheezing or shortness of breath., Disp: , Rfl:    chlorthalidone (HYGROTON) 25 MG tablet, Take 1 tablet (25 mg total) by mouth daily., Disp: 90 tablet, Rfl: 0   D3-50 1.25 MG (50000 UT) capsule, Take 50,000 Units by mouth once a week. gyn,  Disp: , Rfl:    ibuprofen (ADVIL) 600 MG tablet, Take 1 tablet (600 mg total) by mouth every 8 (eight) hours as needed., Disp: 30 tablet, Rfl: 0   topiramate (TOPAMAX) 25 MG tablet, Take 25 mg by mouth daily. neuro, Disp: , Rfl:   Observations/Objective: Patient is well-developed, well-nourished in no acute distress.  Resting comfortably  at home.  Head is normocephalic, atraumatic.  No labored  breathing.  Speech is clear and coherent with logical content.  Patient is alert and oriented at baseline.  Redness in right eye EOM intact    Assessment and Plan:  1. Acute bacterial conjunctivitis of right eye  - trimethoprim-polymyxin b (POLYTRIM) ophthalmic solution; Place 1 drop into the right eye in the morning, at noon, in the evening, and at bedtime.  Dispense: 10 mL; Refill: 0  -given no trouble with vision, EOM or swelling around or near eye will treat with drops -wash hands often -keep eye clean and clear -warm compresses as needed -use eye drops ordered as directed  -change pillow case daily for next week -info in detail on AVS   Reviewed side effects, risks and benefits of medication.    Patient acknowledged agreement and understanding of the plan.   Past Medical, Surgical, Social History, Allergies, and Medications have been Reviewed.    Follow Up Instructions: I discussed the assessment and treatment plan with the patient. The patient was provided an opportunity to ask questions and all were answered. The patient agreed with the plan and demonstrated an understanding of the instructions.  A copy of instructions were sent to the patient via MyChart unless otherwise noted below.    The patient was advised to call back or seek an in-person evaluation if the symptoms worsen or if the condition fails to improve as anticipated.  Time:  I spent 10 minutes with the patient via telehealth technology discussing the above problems/concerns.    [name], NP

## 2022-10-07 ENCOUNTER — Ambulatory Visit: Payer: Medicaid Other | Admitting: Family Medicine

## 2022-10-07 ENCOUNTER — Telehealth: Payer: Self-pay

## 2022-10-07 NOTE — Telephone Encounter (Signed)
Called pt concerning her message about bp and aneurysm. Pt was seeing [facility] neuro for intracranial hypertension. She stated she never heard about an appt from them. I explained to her that Yet[facility] Barre is not trained in this diagnosis. She stated she had headache last night with heat going through body. And her bp is 180/110. I explained it would be best for her to go to [facility] where they can get the ball rolling again concerning her condition. Pt agreed and will go there

## 2022-10-12 ENCOUNTER — Telehealth: Payer: Self-pay | Admitting: Family Medicine

## 2022-10-14 ENCOUNTER — Ambulatory Visit (INDEPENDENT_AMBULATORY_CARE_PROVIDER_SITE_OTHER): Payer: Medicaid Other | Admitting: Family Medicine

## 2022-10-14 ENCOUNTER — Encounter: Payer: Self-pay | Admitting: Family Medicine

## 2022-10-14 VITALS — BP 164/100 | HR 77 | Ht 65.0 in | Wt 176.0 lb

## 2022-10-14 DIAGNOSIS — K219 Gastro-esophageal reflux disease without esophagitis: Secondary | ICD-10-CM | POA: Diagnosis not present

## 2022-10-14 DIAGNOSIS — I10 Essential (primary) hypertension: Secondary | ICD-10-CM

## 2022-10-14 MED ORDER — PANTOPRAZOLE SODIUM 40 MG PO TBEC
40.0000 mg | DELAYED_RELEASE_TABLET | Freq: Every day | ORAL | 2 refills | Status: DC
Start: 2022-10-14 — End: 2023-02-17
  Filled 2023-01-10: qty 30, 30d supply, fill #0

## 2022-10-14 MED ORDER — CHLORTHALIDONE 25 MG PO TABS
25.0000 mg | ORAL_TABLET | Freq: Every day | ORAL | 0 refills | Status: DC
Start: 2022-10-14 — End: 2023-01-10

## 2022-10-14 NOTE — Progress Notes (Signed)
Date:  10/14/2022   Name:  [name]   DOB:  September 10, 1985   MRN:  [id number]   Chief Complaint: Hypertension (Out of bp meds x 3 months)  Hypertension This is a chronic problem. The current episode started more than 1 year ago. The problem has been gradually worsening (off meds) since onset. The problem is controlled. Associated symptoms include blurred vision and headaches. Pertinent negatives include no chest pain, malaise/fatigue, neck pain, orthopnea, palpitations, peripheral edema, PND, shortness of breath or sweats. There are no associated agents to hypertension. The current treatment provides moderate improvement. There is no history of CAD/MI or CVA. There is no history of chronic renal disease, a hypertension causing med or renovascular disease.  Gastroesophageal Reflux She complains of heartburn. She reports no chest pain, no coughing, no dysphagia or no wheezing. This is a new problem. The current episode started more than 1 month ago. The problem has been waxing and waning. The heartburn is of mild intensity. The symptoms are aggravated by certain foods. She has tried an antacid for the symptoms. The treatment provided moderate relief.    Lab Results  Component Value Date   NA 138 05/01/2019   K 3.9 05/01/2019   CO2 23 05/01/2019   GLUCOSE 101 (H) 05/01/2019   BUN 12 05/01/2019   CREATININE 0.96 05/01/2019   CALCIUM 8.7 (L) 05/01/2019   GFRNONAA >60 05/01/2019   No results found for: "CHOL", "HDL", "LDLCALC", "LDLDIRECT", "TRIG", "CHOLHDL" Lab Results  Component Value Date   TSH 1.830 02/17/2021   No results found for: "HGBA1C" Lab Results  Component Value Date   WBC 5.1 05/01/2019   HGB 14.3 05/01/2019   HCT 44.2 05/01/2019   MCV 95.3 05/01/2019   PLT 217 05/01/2019   Lab Results  Component Value Date   ALT 12 05/03/2018   AST 18 05/03/2018   ALKPHOS 53 05/03/2018   BILITOT 0.6 05/03/2018   No results found for: "25OHVITD2", "25OHVITD3", "VD25OH"    Review of Systems  Constitutional:  Negative for malaise/fatigue.  Eyes:  Positive for blurred vision.  Respiratory:  Negative for cough, chest tightness, shortness of breath and wheezing.   Cardiovascular:  Negative for chest pain, palpitations, orthopnea and PND.  Gastrointestinal:  Positive for heartburn. Negative for dysphagia.  Musculoskeletal:  Negative for neck pain.  Neurological:  Positive for headaches.    Patient Active Problem List   Diagnosis Date Noted   Normal labor and delivery 01/10/2015   SVD (spontaneous vaginal delivery) 01/10/2015   Normal pregnancy, repeat 01/10/2015   Normal pregnancy in multigravida in third trimester 01/09/2015    No Known Allergies  Past Surgical History:  Procedure Laterality Date   DILATION AND CURETTAGE OF UTERUS     TONSILLECTOMY     WISDOM TOOTH EXTRACTION      Social History   Tobacco Use   Smoking status: Never   Smokeless tobacco: Never  Substance Use Topics   Alcohol use: Yes    Comment: occ   Drug use: No     Medication list has been reviewed and updated.  Current Meds  Medication Sig   acetaZOLAMIDE (DIAMOX) 250 MG tablet Take by mouth.   albuterol (PROVENTIL HFA;VENTOLIN HFA) 108 (90 BASE) MCG/ACT inhaler Inhale 2 puffs into the lungs every 6 (six) hours as needed for wheezing or shortness of breath.   D3-50 1.25 MG (50000 UT) capsule Take 50,000 Units by mouth once a week. gyn   trimethoprim-polymyxin b (POLYTRIM)  ophthalmic solution Place 1 drop into the right eye in the morning, at noon, in the evening, and at bedtime.   [DISCONTINUED] ibuprofen (ADVIL) 600 MG tablet Take 1 tablet (600 mg total) by mouth every 8 (eight) hours as needed.       10/14/2022    3:09 PM 04/14/2022    4:26 PM 12/04/2021    9:33 AM 05/11/2021    2:00 PM  GAD 7 : Generalized Anxiety Score  Nervous, Anxious, on Edge 0  0 0  Control/stop worrying 0  0 2  Worry too much - different things 0  0 2  Trouble relaxing 0  0 2  Restless  0  0 0  Easily annoyed or irritable 0  0 2  Afraid - awful might happen 0  0 1  Total GAD 7 Score 0  0 9  Anxiety Difficulty Not difficult at all  Not difficult at all Somewhat difficult     Information is confidential and restricted. Go to Review Flowsheets to unlock data.       10/14/2022    3:09 PM 04/14/2022    4:23 PM 12/04/2021    9:32 AM  Depression screen PHQ 2/9  Decreased Interest 0  0  Down, Depressed, Hopeless 0  0  PHQ - 2 Score 0  0  Altered sleeping 0  0  Tired, decreased energy 0  0  Change in appetite 0  0  Feeling bad or failure about yourself  0  0  Trouble concentrating 0  0  Moving slowly or fidgety/restless 0  0  Suicidal thoughts 0  0  PHQ-9 Score 0  0  Difficult doing work/chores Not difficult at all  Not difficult at all     Information is confidential and restricted. Go to Review Flowsheets to unlock data.    BP Readings from Last 3 Encounters:  10/14/22 (!) 164/100  12/04/21 (!) 148/100  11/28/21 (!) 134/102    Physical Exam Vitals and nursing note reviewed.  HENT:     Head: Normocephalic.     Right Ear: Tympanic membrane and ear canal normal.     Left Ear: Tympanic membrane and ear canal normal.     Nose: Nose normal.     Mouth/Throat:     Mouth: Mucous membranes are moist.  Eyes:     General:        Right eye: No discharge.        Left eye: No discharge.     Extraocular Movements: Extraocular movements intact.     Conjunctiva/sclera: Conjunctivae normal.     Pupils: Pupils are equal, round, and reactive to light.  Neck:     Vascular: No carotid bruit.  Cardiovascular:     Rate and Rhythm: Normal rate and regular rhythm.     Pulses: Normal pulses.     Heart sounds: No murmur heard.    No friction rub. No gallop.  Pulmonary:     Effort: Pulmonary effort is normal. No respiratory distress.     Breath sounds: No wheezing, rhonchi or rales.  Abdominal:     Tenderness: There is no abdominal tenderness.  Musculoskeletal:     Cervical  back: Normal range of motion and neck supple. No rigidity or tenderness.  Lymphadenopathy:     Cervical: No cervical adenopathy.  Neurological:     Mental Status: She is alert.     Wt Readings from Last 3 Encounters:  10/14/22 176 lb (79.8 kg)  12/04/21  188 lb (85.3 kg)  11/27/21 182 lb (82.6 kg)    BP (!) 164/100 (BP Location: Right Arm, Cuff Size: Large)   Pulse 77   Ht 5\' 5"  (1.651 m)   Wt 176 lb (79.8 kg)   LMP 09/30/2022 (Exact Date)   SpO2 98%   Breastfeeding No   BMI 29.29 kg/m   Assessment and Plan:  1. Primary hypertension Chronic.  Controlled.  Stable.  Patient has elevated blood pressure but however she has been out of her chlorthalidone for 3 weeks.  Blood pressure today is elevated 164/100 we will resume her chlorthalidone and will recheck in 6 weeks. - chlorthalidone (HYGROTON) 25 MG tablet; Take 1 tablet (25 mg total) by mouth daily.  Dispense: 90 tablet; Refill: 0  2. Gastroesophageal reflux disease, unspecified whether esophagitis present Chronic.  Controlled.  Stable.  Patient has new onset of indigestion/heartburn.  We will treat with pantoprazole 40 mg once a day and recheck when she returns for blood pressure check. - pantoprazole (PROTONIX) 40 MG tablet; Take 1 tablet (40 mg total) by mouth daily.  Dispense: 30 tablet; Refill: 3    Elizabeth Sauer, MD

## 2022-10-17 ENCOUNTER — Other Ambulatory Visit: Payer: Self-pay

## 2022-10-17 ENCOUNTER — Emergency Department (HOSPITAL_BASED_OUTPATIENT_CLINIC_OR_DEPARTMENT_OTHER)
Admission: EM | Admit: 2022-10-17 | Discharge: 2022-10-18 | Disposition: A | Discharge status: Home / Self Care | Payer: Medicaid Other | Attending: Emergency Medicine | Admitting: Emergency Medicine

## 2022-10-17 ENCOUNTER — Encounter (HOSPITAL_BASED_OUTPATIENT_CLINIC_OR_DEPARTMENT_OTHER): Payer: Self-pay

## 2022-10-17 DIAGNOSIS — J45909 Unspecified asthma, uncomplicated: Secondary | ICD-10-CM | POA: Diagnosis not present

## 2022-10-17 DIAGNOSIS — R5383 Other fatigue: Secondary | ICD-10-CM | POA: Diagnosis present

## 2022-10-17 DIAGNOSIS — T887XXA Unspecified adverse effect of drug or medicament, initial encounter: Secondary | ICD-10-CM | POA: Insufficient documentation

## 2022-10-17 DIAGNOSIS — E860 Dehydration: Secondary | ICD-10-CM

## 2022-10-17 DIAGNOSIS — Z20822 Contact with and (suspected) exposure to covid-19: Secondary | ICD-10-CM | POA: Diagnosis not present

## 2022-10-17 DIAGNOSIS — T50905A Adverse effect of unspecified drugs, medicaments and biological substances, initial encounter: Secondary | ICD-10-CM

## 2022-10-17 HISTORY — DX: Benign intracranial hypertension: G93.2

## 2022-10-17 LAB — URINALYSIS, ROUTINE W REFLEX MICROSCOPIC
Bilirubin Urine: NEGATIVE
Glucose, UA: NEGATIVE mg/dL
Hgb urine dipstick: NEGATIVE
Ketones, ur: 40 mg/dL — AB
Nitrite: NEGATIVE
Protein, ur: 30 mg/dL — AB
Specific Gravity, Urine: 1.03 (ref 1.005–1.030)
pH: 5.5 (ref 5.0–8.0)

## 2022-10-17 LAB — COMPREHENSIVE METABOLIC PANEL
ALT: 10 U/L (ref 0–44)
AST: 15 U/L (ref 15–41)
Albumin: 5.3 g/dL — ABNORMAL HIGH (ref 3.5–5.0)
Alkaline Phosphatase: 57 U/L (ref 38–126)
Anion gap: 14 (ref 5–15)
BUN: 19 mg/dL (ref 6–20)
CO2: 20 mmol/L — ABNORMAL LOW (ref 22–32)
Calcium: 9.6 mg/dL (ref 8.9–10.3)
Chloride: 101 mmol/L (ref 98–111)
Creatinine, Ser: 1.12 mg/dL — ABNORMAL HIGH (ref 0.44–1.00)
GFR, Estimated: >60 mL/min (ref >60)
Glucose, Bld: 82 mg/dL (ref 70–99)
Potassium: 3.3 mmol/L — ABNORMAL LOW (ref 3.5–5.1)
Sodium: 135 mmol/L (ref 135–145)
Total Bilirubin: 1 mg/dL (ref 0.3–1.2)
Total Protein: 8.7 g/dL — ABNORMAL HIGH (ref 6.5–8.1)

## 2022-10-17 LAB — CBC
HCT: 42.6 % (ref 36.0–46.0)
Hemoglobin: 14.6 g/dL (ref 12.0–15.0)
MCH: 30.5 pg (ref 26.0–34.0)
MCHC: 34.3 g/dL (ref 30.0–36.0)
MCV: 88.9 fL (ref 80.0–100.0)
Platelets: 361 10*3/uL (ref 150–400)
RBC: 4.79 MIL/uL (ref 3.87–5.11)
RDW: 12.9 % (ref 11.5–15.5)
WBC: 12.1 10*3/uL — ABNORMAL HIGH (ref 4.0–10.5)
nRBC: 0 % (ref 0.0–0.2)

## 2022-10-17 LAB — PREGNANCY, URINE: Preg Test, Ur: NEGATIVE

## 2022-10-17 LAB — LIPASE, BLOOD: Lipase: 18 U/L (ref 11–51)

## 2022-10-17 MED ORDER — SODIUM CHLORIDE 0.9 % IV BOLUS
1000.0000 mL | Freq: Once | INTRAVENOUS | Status: AC
  Administered 2022-10-18: 1000 mL via INTRAVENOUS

## 2022-10-17 NOTE — ED Provider Notes (Signed)
[place] EMERGENCY DEPARTMENT AT [facility] Provider Note   CSN: [id number] Arrival date & time: 10/17/22  2220     History {Add pertinent medical, surgical, social history, OB history to HPI:1} Chief Complaint  Patient presents with   Dehydration    [name] is a 37 y.o. female.  HPI     This is a 37 year old female with recent diagnosis of pseudotumor cerebri placed on acetazolamide who presents with concerns for dehydration.  Patient states that she has had generalized fatigue, dry mouth, decreased appetite and nausea over the last several days.  She started taking acetazolamide last week and is supposed to be titrating it up.  She states she can barely go to work and function.  She is concerned she may be dehydrated.  She has not had any fevers or cough.  No upper respiratory symptoms.  Denies headache.  Home Medications Prior to Admission medications   Medication Sig Start Date End Date Taking? Authorizing Provider  acetaZOLAMIDE (DIAMOX) 250 MG tablet Take by mouth. 10/08/22   [provider]  albuterol (PROVENTIL HFA;VENTOLIN HFA) 108 (90 BASE) MCG/ACT inhaler Inhale 2 puffs into the lungs every 6 (six) hours as needed for wheezing or shortness of breath.    [provider]  chlorthalidone (HYGROTON) 25 MG tablet Take 1 tablet (25 mg total) by mouth daily. 10/14/22   Duanne Limerick, MD  D3-50 1.25 MG (50000 UT) capsule Take 50,000 Units by mouth once a week. gyn 11/23/21   [provider]  pantoprazole (PROTONIX) 40 MG tablet Take 1 tablet (40 mg total) by mouth daily. 10/14/22   Duanne Limerick, MD  trimethoprim-polymyxin b (POLYTRIM) ophthalmic solution Place 1 drop into the right eye in the morning, at noon, in the evening, and at bedtime. 10/06/22   [name], NP      Allergies    Amoxicillin and Tape    Review of Systems   Review of Systems  Constitutional:  Positive for fatigue. Negative for fever.  Respiratory:   Negative for shortness of breath.   Cardiovascular:  Negative for chest pain.  Gastrointestinal:  Positive for nausea. Negative for abdominal pain and vomiting.  Neurological:  Negative for headaches.  All other systems reviewed and are negative.   Physical Exam Updated Vital Signs BP (!) 145/90 (BP Location: Right Arm)   Pulse 91   Temp 98.1 F (36.7 C)   Resp 18   Ht 1.651 m (5\' 5" )   Wt 79.8 kg   LMP 09/30/2022 (Exact Date)   SpO2 99%   BMI 29.29 kg/m  Physical Exam Vitals and nursing note reviewed.  Constitutional:      Appearance: She is well-developed. She is not ill-appearing.  HENT:     Head: Normocephalic and atraumatic.     Mouth/Throat:     Mouth: Mucous membranes are dry.  Eyes:     Pupils: Pupils are equal, round, and reactive to light.  Cardiovascular:     Rate and Rhythm: Normal rate and regular rhythm.     Heart sounds: Normal heart sounds.  Pulmonary:     Effort: Pulmonary effort is normal. No respiratory distress.     Breath sounds: No wheezing.  Abdominal:     Palpations: Abdomen is soft.     Tenderness: There is no abdominal tenderness.  Musculoskeletal:     Cervical back: Neck supple.  Skin:    General: Skin is warm and dry.  Neurological:  Mental Status: She is alert and oriented to person, place, and time.  Psychiatric:        Mood and Affect: Mood normal.     ED Results / Procedures / Treatments   Labs (all labs ordered are listed, but only abnormal results are displayed) Labs Reviewed  COMPREHENSIVE METABOLIC PANEL - Abnormal; Notable for the following components:      Result Value   Potassium 3.3 (*)    CO2 20 (*)    Creatinine, Ser 1.12 (*)    Total Protein 8.7 (*)    Albumin 5.3 (*)    All other components within normal limits  CBC - Abnormal; Notable for the following components:   WBC 12.1 (*)    All other components within normal limits  URINALYSIS, ROUTINE W REFLEX MICROSCOPIC - Abnormal; Notable for the following  components:   APPearance HAZY (*)    Ketones, ur 40 (*)    Protein, ur 30 (*)    Leukocytes,Ua TRACE (*)    Bacteria, UA RARE (*)    All other components within normal limits  LIPASE, BLOOD  PREGNANCY, URINE    EKG None  Radiology No results found.  Procedures Procedures  {Document cardiac monitor, telemetry assessment procedure when appropriate:1}  Medications Ordered in ED Medications  sodium chloride 0.9 % bolus 1,000 mL (has no administration in time range)    ED Course/ Medical Decision Making/ A&P   {   Click here for ABCD2, HEART and other calculatorsREFRESH Note before signing :1}                              Medical Decision Making Amount and/or Complexity of Data Reviewed Labs: ordered.   ***  {Document critical care time when appropriate:1} {Document review of labs and clinical decision tools ie heart score, Chads2Vasc2 etc:1}  {Document your independent review of radiology images, and any outside records:1} {Document your discussion with family members, caretakers, and with consultants:1} {Document social determinants of health affecting pt's care:1} {Document your decision making why or why not admission, treatments were needed:1} Final Clinical Impression(s) / ED Diagnoses Final diagnoses:  None    Rx / DC Orders ED Discharge Orders     None

## 2022-10-17 NOTE — ED Triage Notes (Signed)
Pt presents with severe fatigue, dry mouth, excesive thirst, decreased appetite, nausea that started a couple days ago. Pt states she has a pseudotumor and takes a diuretic for it.

## 2022-10-18 ENCOUNTER — Telehealth: Payer: Self-pay

## 2022-10-18 LAB — RESP PANEL BY RT-PCR (RSV, FLU A&B, COVID)  RVPGX2
Influenza A by PCR: NEGATIVE
Influenza B by PCR: NEGATIVE
Resp Syncytial Virus by PCR: NEGATIVE
SARS Coronavirus 2 by RT PCR: NEGATIVE

## 2022-10-18 NOTE — Discharge Instructions (Signed)
You were seen today for with concerns for dehydration and adverse effects of your medication.  Your lab work does indicate mild dehydration.  You were given fluids.  Make sure that you are taking plenty of fluids at home.  Discussed with your neurologist concerns regarding your acetazolamide.

## 2022-10-18 NOTE — Transitions of Care (Post Inpatient/ED Visit) (Signed)
   10/18/2022  Name: [name] MRN: [id number] DOB: Aug 21, 1985  Today's TOC FU Call Status: Today's TOC FU Call Status:: Successful TOC FU Call Completed TOC FU Call Complete Date: 10/18/22 Patient's Name and Date of Birth confirmed.  Transition Care Management Follow-up Telephone Call Date of Discharge: 10/18/22 Discharge Facility: [facility] ([facility]) Type of Discharge: Emergency Department Reason for ED Visit: Other: How have you been since you were released from the hospital?: Better Any questions or concerns?: No  Items Reviewed: Did you receive and understand the discharge instructions provided?: No Medications obtained,verified, and reconciled?: Yes (Medications Reviewed) Any new allergies since your discharge?: No Dietary orders reviewed?: No Do you have support at home?: No  Medications Reviewed Today: Medications Reviewed Today   Medications were not reviewed in this encounter     Home Care and Equipment/Supplies: Were Home Health Services Ordered?: No Any new equipment or medical supplies ordered?: No  Functional Questionnaire: Do you need assistance with bathing/showering or dressing?: No Do you need assistance with meal preparation?: No Do you need assistance with eating?: No Do you have difficulty maintaining continence: No Do you need assistance with getting out of bed/getting out of a chair/moving?: No Do you have difficulty managing or taking your medications?: No  Follow up appointments reviewed: PCP Follow-up appointment confirmed?: No MD Provider Line Number:[phone] Given: No Specialist Hospital Follow-up appointment confirmed?: NA Do you need transportation to your follow-up appointment?: No Do you understand care options if your condition(s) worsen?: Yes-patient verbalized understanding    SIGNATURE

## 2022-10-19 ENCOUNTER — Other Ambulatory Visit: Payer: Self-pay | Admitting: Family Medicine

## 2022-10-19 DIAGNOSIS — K219 Gastro-esophageal reflux disease without esophagitis: Secondary | ICD-10-CM

## 2022-10-19 DIAGNOSIS — I10 Essential (primary) hypertension: Secondary | ICD-10-CM

## 2022-10-19 NOTE — Telephone Encounter (Signed)
Medication Refill - Medication:  chlorthalidone (HYGROTON) 25 MG tablet   pantoprazole (PROTONIX) 40 MG tablet  Has the patient contacted their pharmacy? No. (Pt lost these 2 prescriptions, she thinks she left at [facility] Preferred Pharmacy (with phone number or street name): [id number] - [place], [place] - [address] AT [facility] OF [address]  Has the patient been seen for an appointment in the last year OR does the patient have an upcoming appointment? Yes.    Pt is still looking around for them, but it does not look promising

## 2022-10-27 ENCOUNTER — Ambulatory Visit (INDEPENDENT_AMBULATORY_CARE_PROVIDER_SITE_OTHER): Payer: Medicaid Other | Admitting: Clinical

## 2022-10-27 DIAGNOSIS — F32A Depression, unspecified: Secondary | ICD-10-CM

## 2022-10-27 DIAGNOSIS — F4310 Post-traumatic stress disorder, unspecified: Secondary | ICD-10-CM

## 2022-10-27 DIAGNOSIS — F411 Generalized anxiety disorder: Secondary | ICD-10-CM

## 2022-10-27 DIAGNOSIS — F410 Panic disorder [episodic paroxysmal anxiety] without agoraphobia: Secondary | ICD-10-CM | POA: Diagnosis not present

## 2022-10-27 NOTE — Progress Notes (Unsigned)
THERAPIST PROGRESS NOTE  Session Time: 8:20-9:00am  Session #12  Participation Level: Active  Behavioral Response: Casual and Neat Alert Euthymic   Type of Therapy: Individual Therapy  Treatment Goals addressed: LTG: [name] will score less than 5 on the Generalized Anxiety Disorder 7 Scale (GAD-7)   STG: [name] will practice problem solving skills 3 times per week for the next 4 weeks.   LTG: Reduce frequency, intensity, and duration of depression symptoms so that daily functioning is improved  STG: Reduce overall depression score by a minimum of 50% on the Patient Health Questionnaire (PHQ-9)  STG: [name] will practice emotion regulation skills 3 time(s) per week for the next 26 week(s)  STG: Report a decrease in PTSD symptoms as evidenced by a 50% reduction in overall score on a clinician administered PTSD assessment screen/scale  STG: [name] will experience a reduction in exaggerated beliefs about self and others that interfere with trauma resolution as evidenced by self-report  STG: Practice interpersonal effectiveness skills 3 times per week for the next 26 weeks  STG: [name] will practice conflict resolution skills at least 1 times per week for the next 26 weeks  STG: [name] will verbalize an increased sense of mastery over PTSD symptoms by using several techniques to cope with flashbacks, decrease the power of triggers, and decrease negative thinking   ProgressTowards Goals: Progressing  Interventions: Assertiveness Training and Supportive   Summary: [name] is a 37 y.o. female who presents with depression and anxiety.  She had forgotten the appointment but when she was called, was close by so came to the remainder of it.  She reported feeling happy but tired.  She is very happy in her job, loves it, and feels that she is already starting to catch up with her bills.  Her son's father is helping out more with him, even coming up from [place] to do  son, and has not been bothering her which she thinks is because he has another baby on the way.  Her son has a new trainer since she took him away from the one that she had been dating when their relationship soured.  The rest of session was spent in processing with her how he has tried to restart their relationship even though it was unsatisfactory even when it was going on.  She has been straightforward with him without being mean about what he lacked in the relationship.  He got angry but that did not bother her.  His logic was what bothered her.  He has been continuing to reach out to her, but she has ignored phone calls and texts.  We talked about how she has learned yet more about what she is looking for from that relationship.  She also learned about setting boundaries not just with a man but with herself.  She is not as stressed as she was previously.  She does have an upcoming neurology visit because she has been to the ER due to dehydration related to her brain swelling.  Suicidal/Homicidal: No  Therapist Response:  Patient is progressing AEB engaging in scheduled therapy session.  She presented oriented x5 and stated she was feeling "happy."  We processed the now-ended relationship with her ex.  Throughout the session, [name] gave patient the opportunity to explore thoughts and feelings associated with current life situations and past/present external stressors.   [name] encouraged patient's expression of feelings and validated patient's thoughts using empathy, active listening, open body language, and unconditional positive regard.  Recommendations:  Return to therapy in 1 week, continue to engage in self care behaviors  Plan: Return again in 1 week   Next appointment 9/23  Diagnosis:  Generalized anxiety disorder with panic attacks  Post traumatic stress disorder  Depression, unspecified depression type  Collaboration of Care: Other - none needed currently  Patient/Guardian was  advised Release of Information must be obtained prior to any record release in order to collaborate their care with an outside provider. Patient/Guardian was advised if they have not already done so to contact the registration department to sign all necessary forms in order for [place] to release information regarding their care.   Consent: Patient/Guardian gives verbal consent for treatment and assignment of benefits for services provided during this visit. Patient/Guardian expressed understanding and agreed to proceed.   [name], LCSW 10/27/2022

## 2022-10-28 ENCOUNTER — Encounter (HOSPITAL_COMMUNITY): Payer: Self-pay | Admitting: Clinical

## 2022-11-01 ENCOUNTER — Ambulatory Visit (INDEPENDENT_AMBULATORY_CARE_PROVIDER_SITE_OTHER): Payer: Medicaid Other | Admitting: Clinical

## 2022-11-01 DIAGNOSIS — F411 Generalized anxiety disorder: Secondary | ICD-10-CM

## 2022-11-01 DIAGNOSIS — F410 Panic disorder [episodic paroxysmal anxiety] without agoraphobia: Secondary | ICD-10-CM

## 2022-11-01 DIAGNOSIS — F4310 Post-traumatic stress disorder, unspecified: Secondary | ICD-10-CM

## 2022-11-01 DIAGNOSIS — F32A Depression, unspecified: Secondary | ICD-10-CM

## 2022-11-01 NOTE — Progress Notes (Signed)
Patient ID: [name], female   DOB: October 02, 1985, 37 y.o.   MRN: [id number]  Therapy Progress Note  Patient had an appointment scheduled with therapist on 11/01/2022  at 9:00am.  When she did not arrive into the virtual session, [name] sent her a text to [phone]  at 9:05am and a second text at 9:12am.  She still did not arrive for the session at 9:15am, so was called and a message was left.  She called back, stating she was at work and could not do the session.  She did express concern about her growing lack of interest in doing things including being sexually active, asked if it could be her blood pressure medicine.  She was referred to see her PCP.  [name] encouraged her to do things even when she is not motivated, and taught her re scaling questions before and after to see the effect of the behavioral activation.  [name] showed empathy and concern, reminded her of appointment on 9/30.    Encounter Diagnoses  Name Primary?   Depression, unspecified depression type Yes   Post traumatic stress disorder    Generalized anxiety disorder with panic attacks      Ambrose Mantle, LCSW 11/01/2022, 9:30 AM

## 2022-11-04 ENCOUNTER — Ambulatory Visit: Payer: Medicaid Other | Admitting: Family Medicine

## 2022-11-07 ENCOUNTER — Encounter (HOSPITAL_COMMUNITY): Payer: Self-pay

## 2022-11-07 NOTE — Progress Notes (Unsigned)
THERAPIST PROGRESS NOTE  Session Time: ***am  Session #13  Participation Level: Active  Behavioral Response: Casual and Neat Alert Euthymic ***  Type of Therapy: Individual Therapy  Treatment Goals addressed:  *** New treatment plan established:  Problem: Anxiety Goal: LTG: Lajoya will score less than 5 on the Generalized Anxiety Disorder 7 Scale (GAD-7) Goal: STG: Keon will practice problem solving skills 3 times per week for the next 4 weeks. Goal: Ability to verbalize positive feeling about self will improve Goal: STG-Patient can identify triggers for anxiety (Resolved) Goal: LTG: Identify and decrease cognitive distortions contributing negatively to mood and behavior Goal: LTG: Learn additional communication techniques that can help her navigate her relationships. Intervention: Provide Tonia with educational information and reading material on anxiety, its causes, and symptoms. Intervention: Work with patient individually to identify the major components of a recent episode of anxiety: physical symptoms, major thoughts and images, and major behaviors they experienced Intervention: Work with Teacher, early years/pre to identify 3 personal goals for managing their anxiety to work on during current treatment. Intervention: Work with [name] to complete a TRAP analysis (trigger, response, avoidance pattern) of their avoidant behaviors.  Intervention: Work with [name] to identify a minimum of 3 consequences of avoidance. Intervention: Work with [name] to identify a minimum of 3 alternative coping behaviors to avoidance. Intervention: Instruct Alisia on systematic desensitization and development of a hierarchy of feared situations in weekly individual session. Intervention: Continue cognitive-behavioral therapy for anxiety and depression  Problem: OP Depression Goal: LTG: Reduce frequency, intensity, and duration of depression symptoms so that daily functioning is  improved Goal: STG: Reduce overall depression score by a minimum of 50% on the Patient Health Questionnaire (PHQ-9) Goal: STG: Explore how core beliefs about self impact her negatively and positively, as well as how she can establish and maintain more helpful boundaries in the future for a more balanced result. Goal: LTG: Learn additional communication techniques that can help her navigate her relationships. Goal: LTG: Provide options for patient to work on forgiveness, shame, sleep, relationship to food, or other issues as appropriate and as presents during sessions Intervention: Therapist will educate patient on cognitive distortions and the rationale for treatment of depression Intervention: Help to identify 3 cognitive distortions they are currently using and write reframing statements to replace them Intervention: Provide pleasant activities list and help to select 2 activities to practice weekly for the next 26 weeks  Intervention: Teach coping skills and help patient to increase resilience through CBT techniques and through shame work  Problem: PTSD Goal: STG: Makynlie will practice emotion regulation skills 3 time(s) per week for the next 26 week(s) Goal: STG: Report a decrease in PTSD symptoms as evidenced by a 50% reduction in overall score on a clinician administered PTSD assessment screen/scale Goal: STG: Chosen will experience a reduction in exaggerated beliefs about self and others that interfere with trauma resolution as evidenced by self-report Goal: STG: Practice interpersonal effectiveness skills 3 times per week for the next 26 weeks Goal: STG: Jahira will practice conflict resolution skills at least 1 times per week for the next 26 weeks Goal: STG: Fredi will verbalize an increased sense of mastery over PTSD symptoms by using several techniques to cope with flashbacks, decrease the power of triggers, and decrease negative thinking Intervention: Teach Jeda coping  strategies (e.g., writing down thoughts and feelings in a journal; taking deep, slow breaths; calling a support person to talk about memories) to deal with trauma memories and sudden emotional reactions without  becoming emotionally  Intervention: Assign Regnia to read about other trauma survivors (e.g., holocaust victims or war veterans) and some of the coping strategies they use Intervention: Encourage Jerris to join an abuse or trauma support group Intervention: Surveyor, mining on trauma influenced cognitive distortions Intervention: Administrator, arts to identify 5 trauma related cognitive distortions Intervention: Nishka will review pleasant activities list and select 2 activities to practice weekly for the next 26 weeks  ProgressTowards Goals: Progressing  Interventions: Assertiveness Training and Supportive   Summary: KELBI RENSTROM is a 37 y.o. female who presents with depression and anxiety.  ***  Suicidal/Homicidal: No  Therapist Response:  Patient is progressing AEB engaging in scheduled therapy session.  ***e presented oriented x5 and stated ***he was feeling "***."  [name] evaluated patient's medication compliance and self-care since last session.  [name] reviewed the last session with patient who reported ***.  Patient stated ***.  [name] taught CBT-related coping skills, specifically *** and ***.  Patient received these willingly and could discuss them in a way to indicate good comprehension.  [name] assigned patient the task of trying out these new coping skills (***) prior to next session on ***.  [name] encouraged patient to schedule more therapy sessions for the future, as ***  Throughout the session, [name] gave patient the opportunity to explore thoughts and feelings associated with current life situations and past/present external stressors.   [name] encouraged patient's expression of feelings and validated patient's thoughts using empathy, active listening, open body language, and  unconditional positive regard.       Recommendations:  Return to therapy in 1 week, continue to engage in self care behaviors, ***  Plan: Return again in 1 week   Next appointment 10/7  Diagnosis:  Generalized anxiety disorder with panic attacks  Post traumatic stress disorder  Depression, unspecified depression type  Collaboration of Care: Other - none needed currently  Patient/Guardian was advised Release of Information must be obtained prior to any record release in order to collaborate their care with an outside provider. Patient/Guardian was advised if they have not already done so to contact the registration department to sign all necessary forms in order for [place] to release information regarding their care.   Consent: Patient/Guardian gives verbal consent for treatment and assignment of benefits for services provided during this visit. Patient/Guardian expressed understanding and agreed to proceed.   [name], LCSW 11/07/2022

## 2022-11-08 ENCOUNTER — Ambulatory Visit (INDEPENDENT_AMBULATORY_CARE_PROVIDER_SITE_OTHER): Payer: Medicaid Other | Admitting: Clinical

## 2022-11-08 DIAGNOSIS — F32A Depression, unspecified: Secondary | ICD-10-CM

## 2022-11-08 DIAGNOSIS — Z91199 Patient's noncompliance with other medical treatment and regimen due to unspecified reason: Secondary | ICD-10-CM

## 2022-11-08 DIAGNOSIS — F4310 Post-traumatic stress disorder, unspecified: Secondary | ICD-10-CM

## 2022-11-08 DIAGNOSIS — F411 Generalized anxiety disorder: Secondary | ICD-10-CM

## 2022-11-08 DIAGNOSIS — F410 Panic disorder [episodic paroxysmal anxiety] without agoraphobia: Secondary | ICD-10-CM

## 2022-11-08 NOTE — Addendum Note (Signed)
Addended by: [name] on: 11/08/2022 01:00 PM   Modules accepted: Level of Service

## 2022-11-10 ENCOUNTER — Encounter (HOSPITAL_COMMUNITY): Payer: Self-pay | Admitting: Clinical

## 2022-11-15 ENCOUNTER — Ambulatory Visit (INDEPENDENT_AMBULATORY_CARE_PROVIDER_SITE_OTHER): Payer: Medicaid Other | Admitting: Clinical

## 2022-11-15 ENCOUNTER — Encounter (HOSPITAL_COMMUNITY): Payer: Self-pay | Admitting: Clinical

## 2022-11-15 DIAGNOSIS — F4310 Post-traumatic stress disorder, unspecified: Secondary | ICD-10-CM

## 2022-11-15 DIAGNOSIS — F411 Generalized anxiety disorder: Secondary | ICD-10-CM

## 2022-11-15 DIAGNOSIS — F410 Panic disorder [episodic paroxysmal anxiety] without agoraphobia: Secondary | ICD-10-CM

## 2022-11-15 DIAGNOSIS — F331 Major depressive disorder, recurrent, moderate: Secondary | ICD-10-CM | POA: Diagnosis not present

## 2022-11-15 NOTE — Progress Notes (Unsigned)
THERAPIST PROGRESS NOTE  Session Time:  9:04-9:59am  Session #13  Virtual Visit via Video Note  I connected with [name] on 11/15/22 at  9:00 AM EDT by a video enabled telemedicine application and verified that I am speaking with the correct person using two identifiers.  Location: Patient: private office at her workplace Provider: [facility] outpatient therapy office   I discussed the limitations of evaluation and management by telemedicine and the availability of in person appointments. The patient expressed understanding and agreed to proceed.   I discussed the assessment and treatment plan with the patient. The patient was provided an opportunity to ask questions and all were answered. The patient agreed with the plan and demonstrated an understanding of the instructions.   The patient was advised to call back or seek an in-person evaluation if the symptoms worsen or if the condition fails to improve as anticipated.  I provided 55 minutes of non-face-to-face time during this encounter.  Sierra Chad, LCSW    Participation Level: Active  Behavioral Response: Casual and Neat Alert Depressed and Hopeless   Type of Therapy: Individual Therapy  Treatment Goals addressed: New Treatment Plan Established:  Problem: Anxiety Goal: LTG: [name] will score less than 5 on the Generalized Anxiety Disorder 7 Scale (GAD-7) Goal: STG: [name] will practice problem solving skills 3 times per week for the next 4 weeks. Goal: Ability to verbalize positive feeling about self will improve Goal: STG-Patient can identify triggers for anxiety (Resolved) Goal: LTG: Identify and decrease cognitive distortions contributing negatively to mood and behavior Goal: LTG: Learn additional communication techniques that can help her navigate her relationships. Goal:  LTG:  Learn the types of boundaries, the necessity, and how to implement boundaries even when it is difficult   Intervention: Provide [name] with educational information and reading material on anxiety, its causes, and symptoms. Intervention: Work with patient individually to identify the major components of a recent episode of anxiety: physical symptoms, major thoughts and images, and major behaviors they experienced Intervention: Work with [name]/pre to identify 3 personal goals for managing their anxiety to work on during current treatment. Intervention: Work with [name] to complete a TRAP analysis (trigger, response, avoidance pattern) of their avoidant behaviors. Intervention: Work with [name] to identify a minimum of 3 consequences of avoidance. Intervention: Work with [name] to identify a minimum of 3 alternative coping behaviors to avoidance. Intervention: Instruct [name] on systematic desensitization and development of a hierarchy of feared situations in weekly individual session. Intervention: Continue cognitive-behavioral therapy for anxiety and depression  Template: Depression (OP) Problem: OP Depression Goal: LTG: Reduce frequency, intensity, and duration of depression symptoms so that daily functioning is improved Goal: STG: Reduce overall depression score by a minimum of 50% on the Patient Health Questionnaire (PHQ-9) Goal: STG: Explore how core beliefs about self impact her negatively and positively, as well as how she can establish and maintain more helpful boundaries in the future for a more balanced result. Goal: LTG: Learn additional communication techniques that can help her navigate her relationships. Goal: LTG: Provide options for patient to work on forgiveness, shame, sleep, relationship to food, or other issues as appropriate and as presents during sessions Intervention: Therapist will educate patient on cognitive distortions and the rationale for treatment of depression Intervention: Help to identify 3 cognitive distortions they are currently using and write reframing  statements to replace them Intervention: Provide pleasant activities list and help to select 2 activities to practice weekly for the next  26 weeks Intervention: Teach coping skills and help patient to increase resilience through CBT techniques and through shame work  Problem: BH CCP Acute or Chronic Trauma Reaction Goal: STG: [name] will practice emotion regulation skills 3 time(s) per week for the next 26 week(s) Goal: STG: Report a decrease in PTSD symptoms as evidenced by a 50% reduction in overall score on a clinician administered PTSD assessment screen/scale Goal: STG: [name] will experience a reduction in exaggerated beliefs about self and others that interfere with trauma resolution as evidenced by self-report Goal: STG: Practice interpersonal effectiveness skills 3 times per week for the next 26 weeks Goal: STG: [name] will practice conflict resolution skills at least 1 times per week for the next 26 weeks Goal: STG: [name] will verbalize an increased sense of mastery over PTSD symptoms by using several techniques to cope with flashbacks, decrease the power of triggers, and decrease negative thinking Intervention: Teach [name] coping strategies (e.g., writing down thoughts and feelings in a journal; taking deep, slow breaths; calling a support person to talk about memories) to deal with trauma memories and sudden emotional reactions without becoming emotionally Intervention: Assign [name] to read about other trauma survivors (e.g., holocaust victims or war veterans) and some of the coping strategies they use Intervention: Encourage [name] to join an abuse or trauma support group Intervention: Surveyor, mining on trauma influenced cognitive distortions Intervention: Administrator, arts to identify 5 trauma related cognitive distortions Intervention: [name] will review pleasant activities list and select 2 activities to practice weekly for the next 26  weeks  ProgressTowards Goals: Progressing  Interventions: Assertiveness Training, Supportive, and Family Systems   Summary: [name] is a 37 y.o. female who presents with depression and anxiety.  ***  Suicidal/Homicidal: No  Therapist Response:  Patient is progressing AEB engaging in scheduled therapy session.  ***e presented oriented x5 and stated ***he was feeling "***."  [name] evaluated patient's medication compliance, use of coping tools, and self-care, as applicable.  Patient stated ***.  [name] taught ***.  Patient received this willingly and indicated ***comprehension.  [name] assigned patient homework of ***.  [name] encouraged patient to schedule more therapy sessions for the future, as ***.  Throughout the session, [name] gave patient the opportunity to explore thoughts and feelings associated with current life situations and past/present stressors.   [name] challenged patient gently and appropriately to consider different ways of looking at reported issues. [name] encouraged patient's expression of feelings and validated these using empathy, active listening, open body language, and unconditional positive regard.        Recommendations:  Return to therapy in 1 week, continue to engage in self care behaviors  Plan: Return again in 1 week   Next appointment 9/23  Diagnosis:  Major depressive disorder, recurrent episode, moderate (HCC)  Generalized anxiety disorder with panic attacks  Post traumatic stress disorder  Collaboration of Care: Other - none needed currently  Patient/Guardian was advised Release of Information must be obtained prior to any record release in order to collaborate their care with an outside provider. Patient/Guardian was advised if they have not already done so to contact the registration department to sign all necessary forms in order for [place] to release information regarding their care.   Consent: Patient/Guardian gives verbal consent for treatment and assignment  of benefits for services provided during this visit. Patient/Guardian expressed understanding and agreed to proceed.   Sierra Chad, LCSW 11/15/2022

## 2022-11-22 ENCOUNTER — Encounter (HOSPITAL_COMMUNITY): Payer: Self-pay | Admitting: Clinical

## 2022-11-22 ENCOUNTER — Ambulatory Visit (INDEPENDENT_AMBULATORY_CARE_PROVIDER_SITE_OTHER): Payer: Medicaid Other | Admitting: Clinical

## 2022-11-22 DIAGNOSIS — F410 Panic disorder [episodic paroxysmal anxiety] without agoraphobia: Secondary | ICD-10-CM | POA: Diagnosis not present

## 2022-11-22 DIAGNOSIS — F411 Generalized anxiety disorder: Secondary | ICD-10-CM

## 2022-11-22 DIAGNOSIS — F331 Major depressive disorder, recurrent, moderate: Secondary | ICD-10-CM

## 2022-11-22 NOTE — Progress Notes (Unsigned)
THERAPIST PROGRESS NOTE  Session Time:  9:08-10:01am  Session #14  Virtual Visit via Video Note  I connected with [name] on 11/22/22 at  9:00 AM EDT by a video enabled telemedicine application and verified that I am speaking with the correct person using two identifiers.  Location: Patient: private office at her workplace Provider: [facility] outpatient therapy office   I discussed the limitations of evaluation and management by telemedicine and the availability of in person appointments. The patient expressed understanding and agreed to proceed.   I discussed the assessment and treatment plan with the patient. The patient was provided an opportunity to ask questions and all were answered. The patient agreed with the plan and demonstrated an understanding of the instructions.   The patient was advised to call back or seek an in-person evaluation if the symptoms worsen or if the condition fails to improve as anticipated.  I provided 53 minutes of non-face-to-face time during this encounter.  [name], LCSW    Participation Level: Active  Behavioral Response: Casual and Neat Alert Depressed, Hopeless, and Tearful    Type of Therapy: Individual Therapy  Treatment Goals addressed:  *** Goal: LTG: [name] will score less than 5 on the Generalized Anxiety Disorder 7 Scale (GAD-7) Goal: STG: [name] will practice problem solving skills 3 times per week for the next 4 weeks. Goal: Ability to verbalize positive feeling about self will improve Goal: STG-Patient can identify triggers for anxiety (Resolved) Goal: LTG: Identify and decrease cognitive distortions contributing negatively to mood and behavior Goal: LTG: Learn additional communication techniques that can help her navigate her relationships. Goal:  LTG:  Learn the types of boundaries, the necessity, and how to implement boundaries even when it is difficult  Goal: LTG: Reduce frequency,  intensity, and duration of depression symptoms so that daily functioning is improved Goal: STG: Reduce overall depression score by a minimum of 50% on the Patient Health Questionnaire (PHQ-9) Goal: STG: Explore how core beliefs about self impact her negatively and positively, as well as how she can establish and maintain more helpful boundaries in the future for a more balanced result. Goal: LTG: Learn additional communication techniques that can help her navigate her relationships. Goal: LTG: Provide options for patient to work on forgiveness, shame, sleep, relationship to food, or other issues as appropriate and as presents during sessions Goal: STG: [name] will practice emotion regulation skills 3 time(s) per week for the next 26 week(s) Goal: STG: Report a decrease in PTSD symptoms as evidenced by a 50% reduction in overall score on a clinician administered PTSD assessment screen/scale Goal: STG: [name] will experience a reduction in exaggerated beliefs about self and others that interfere with trauma resolution as evidenced by self-report Goal: STG: Practice interpersonal effectiveness skills 3 times per week for the next 26 weeks Goal: STG: [name] will practice conflict resolution skills at least 1 times per week for the next 26 weeks  ProgressTowards Goals: Progressing  Interventions: Assertiveness Training, Supportive, and Family Systems   Summary: [name] is a 37 y.o. female who presents with depression and anxiety.  She talked about how emotional she has been, but still tried to present with a mask of smiling and laughing until [name] challenged this and she revealed for the rest of the session how sad she is and talked about the various problems and issues that are contributing to her sadness.  She has taken herself off the medicine prescribed to drain the excess brain  fluid because it weakens, fatigues her and makes her unable to eat.  [name] encouraged her to contact the  neurologist office to see if there is any replacement or other recommendation.  She stated she has spoken to her supervisor about how important it is for her to be in therapy and that she needs an hour a week to do these sessions, was told how they can go about arranging this.  She stated, "I'm not as far as I thought I was."  She was encouraged to reframe and look at the positives as well.  Some of the issues she is coping with include adjustment to working with a team of other people with various personalities, some of whom may act friendly but then go behind her back to report on her what they see as problems.  She has been told by her supervisor recently to "be mindful of who you're around when you pull your phone out."  She is not accustomed to being what she called "corporate fake" and stated that even though the job has relieved some pressure, it has created other pressure.  She stated she needs to learn how to budget money, because the work is steady but it does tire her so that she does not feel able to go to other jobs such as Producer, television/film/video afterward.  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  Money is tight as a result, but her daughter is quite demanding.  Her mother and aunt are worried about patient's wellbeing and health because this stress is not good for her brain problems (intercranial hypertension).  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  She was reminded that she can take things slowly, that life is not a race.  It is her norm, however, to go full-force into anything she does.  Patient is trying to put structure into place at home, assigning chores to both kids because she does not feel she can do it alone, but 14yo daughter is angry about this while 8yo son is very helpful.  Daughter complains that the younger sibling does  not have to do as much.  She is reluctant to come down too hard on her daughter because she herself remembers acting like this as a teenager toward her own mother.  She wants to make an apology to her mother, in fact.  [name] talked about child development, what to expect, and how a Home Rules Contract might be helpful.  A sample will be sent to her via email.  [name] emphasized using "I" messages when talking with her daughter and also linking consequences to daughter's actions rather than being random.  The use of the Home Rules Contract to put expectations in place so that daughter knows what will happen was demonstrated.  It was compared to when she set boundaries with her ex and then implemented the consequences when he violated those boundaries.  He has been trying to get back in touch and she ignores it.  [name] provided encouragement and support about living with a teenager.  Suicidal/Homicidal: No  Therapist Response:  Patient is progressing AEB engaging in scheduled therapy session.  She presented oriented x5 and stated she was feeling "very emotional, it was a rough week."  [name] evaluated patient's use of coping tools and self-care, as applicable.  Patient stated she is not as far as she had thought she was, was quite sad and distraught about various life circumstances at this session.  [name] taught about how to use a home rules contract as a set of boundaries for the children.  Patient received this willingly and indicated goodcomprehension.  [name] assigned patient homework of looking at the sample contract sent to her and considering how she might want to implement this in her own family.  [name] encouraged patient to schedule more therapy sessions for the future, as there is only one more appointment scheduled.  Throughout the session, [name] gave patient the opportunity to explore thoughts and feelings associated with current life situations and past/present stressors.   [name] challenged patient gently and  appropriately to consider different ways of looking at reported issues. [name] encouraged patient's expression of feelings and validated these using empathy, active listening, open body language, and unconditional positive regard.        Recommendations:  Return to therapy in 1 week, continue to engage in self care behaviors  Plan: Return again in 1 week   Next appointment 9/23  Diagnosis:  Major depressive disorder, recurrent episode, moderate (HCC)  Generalized anxiety disorder with panic attacks  Collaboration of Care: Other - none needed currently  Patient/Guardian was advised Release of Information must be obtained prior to any record release in order to collaborate their care with an outside provider. Patient/Guardian was advised if they have not already done so to contact the registration department to sign all necessary forms in order for [place] to release information regarding their care.   Consent: Patient/Guardian gives verbal consent for treatment and assignment of benefits for services provided during this visit. Patient/Guardian expressed understanding and agreed to proceed.   [name], LCSW 11/22/2022

## 2022-12-06 ENCOUNTER — Encounter (HOSPITAL_COMMUNITY): Payer: Self-pay | Admitting: Family

## 2022-12-06 ENCOUNTER — Other Ambulatory Visit: Payer: Self-pay

## 2022-12-06 ENCOUNTER — Telehealth (HOSPITAL_COMMUNITY): Payer: Self-pay | Admitting: Professional

## 2022-12-06 ENCOUNTER — Ambulatory Visit (HOSPITAL_BASED_OUTPATIENT_CLINIC_OR_DEPARTMENT_OTHER): Payer: Medicaid Other | Admitting: Family

## 2022-12-06 VITALS — BP 135/91 | Ht 64.0 in | Wt 175.0 lb

## 2022-12-06 DIAGNOSIS — F331 Major depressive disorder, recurrent, moderate: Secondary | ICD-10-CM | POA: Diagnosis not present

## 2022-12-06 DIAGNOSIS — F411 Generalized anxiety disorder: Secondary | ICD-10-CM

## 2022-12-06 DIAGNOSIS — F410 Panic disorder [episodic paroxysmal anxiety] without agoraphobia: Secondary | ICD-10-CM | POA: Diagnosis not present

## 2022-12-06 MED ORDER — FLUOXETINE HCL 10 MG PO CAPS: 10.0000 mg | ORAL_CAPSULE | Freq: Every day | ORAL | 0 refills | Status: DC

## 2022-12-06 MED ORDER — HYDROXYZINE HCL 10 MG PO TABS: 10.0000 mg | ORAL_TABLET | Freq: Three times a day (TID) | ORAL | 0 refills | Status: AC | PRN
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
## 2022-12-07 ENCOUNTER — Telehealth (HOSPITAL_COMMUNITY): Payer: Self-pay | Admitting: Licensed Clinical Social Worker

## 2023-01-03 ENCOUNTER — Ambulatory Visit (HOSPITAL_COMMUNITY): Payer: Medicaid Other | Admitting: Family

## 2023-01-10 ENCOUNTER — Other Ambulatory Visit: Payer: Self-pay | Admitting: Family Medicine

## 2023-01-10 ENCOUNTER — Other Ambulatory Visit (HOSPITAL_COMMUNITY): Payer: Self-pay

## 2023-01-10 DIAGNOSIS — I10 Essential (primary) hypertension: Secondary | ICD-10-CM

## 2023-01-11 ENCOUNTER — Other Ambulatory Visit (HOSPITAL_COMMUNITY): Payer: Self-pay

## 2023-01-11 MED ORDER — CHLORTHALIDONE 25 MG PO TABS
25.0000 mg | ORAL_TABLET | Freq: Every day | ORAL | 0 refills | Status: DC
Start: 2023-01-11 — End: 2023-03-14
  Filled 2023-01-11 – 2023-02-17 (×2): qty 30, 30d supply, fill #0

## 2023-01-14 ENCOUNTER — Encounter (HOSPITAL_COMMUNITY): Payer: Self-pay | Admitting: Clinical

## 2023-01-14 ENCOUNTER — Ambulatory Visit (INDEPENDENT_AMBULATORY_CARE_PROVIDER_SITE_OTHER): Payer: Medicaid Other | Admitting: Clinical

## 2023-01-14 DIAGNOSIS — F4310 Post-traumatic stress disorder, unspecified: Secondary | ICD-10-CM

## 2023-01-14 DIAGNOSIS — F331 Major depressive disorder, recurrent, moderate: Secondary | ICD-10-CM | POA: Diagnosis not present

## 2023-01-14 NOTE — Progress Notes (Signed)
THERAPIST PROGRESS NOTE  Session Time:  11:04-11:32am  Session #15  Virtual Visit via Video Note  I connected with Angela Nevin on 01/14/23 at 11:00 AM EST by a video enabled telemedicine application and verified that I am speaking with the correct person using two identifiers.  Location: Patient: private office at her workplace Provider: [facility] outpatient therapy office   I discussed the limitations of evaluation and management by telemedicine and the availability of in person appointments. The patient expressed understanding and agreed to proceed.   I discussed the assessment and treatment plan with the patient. The patient was provided an opportunity to ask questions and all were answered. The patient agreed with the plan and demonstrated an understanding of the instructions.   The patient was advised to call back or seek an in-person evaluation if the symptoms worsen or if the condition fails to improve as anticipated.  I provided 28 minutes of non-face-to-face time during this encounter.  [name], LCSW    Participation Level: Active  Behavioral Response: Casual and Neat Alert Euthymic   Type of Therapy: Individual Therapy  Treatment Goals addressed:   Goal: LTG: Shanti will score less than 5 on the Generalized Anxiety Disorder 7 Scale (GAD-7) Goal: STG: Aijah will practice problem solving skills 3 times per week for the next 4 weeks. Goal: Ability to verbalize positive feeling about self will improve Goal: STG-Patient can identify triggers for anxiety (Resolved) Goal: LTG: Identify and decrease cognitive distortions contributing negatively to mood and behavior Goal: LTG: Learn additional communication techniques that can help her navigate her relationships. Goal:  LTG:  Learn the types of boundaries, the necessity, and how to implement boundaries even when it is difficult  Goal: LTG: Reduce frequency, intensity, and duration of  depression symptoms so that daily functioning is improved Goal: STG: Reduce overall depression score by a minimum of 50% on the Patient Health Questionnaire (PHQ-9) Goal: STG: Explore how core beliefs about self impact her negatively and positively, as well as how she can establish and maintain more helpful boundaries in the future for a more balanced result. Goal: LTG: Learn additional communication techniques that can help her navigate her relationships. Goal: LTG: Provide options for patient to work on forgiveness, shame, sleep, relationship to food, or other issues as appropriate and as presents during sessions Goal: STG: Redith will practice emotion regulation skills 3 time(s) per week for the next 26 week(s) Goal: STG: Report a decrease in PTSD symptoms as evidenced by a 50% reduction in overall score on a clinician administered PTSD assessment screen/scale Goal: STG: Ruchama will experience a reduction in exaggerated beliefs about self and others that interfere with trauma resolution as evidenced by self-report Goal: STG: Practice interpersonal effectiveness skills 3 times per week for the next 26 weeks Goal: STG: Tracy will practice conflict resolution skills at least 1 times per week for the next 26 weeks  ProgressTowards Goals: Progressing  Interventions: Supportive and Other: psychoeducation re medications    Summary: JAIMELEE HANOVER is a 37 y.o. female who presents with depression and anxiety.  She presented oriented x5 and stated she was feeling "pretty good, some irritability but that is usually with my period."  [name] evaluated patient's medication compliance, use of coping tools, and self-care, as applicable.   She has had a 26-month review at work which was very positive, is able to buy her children Christmas presents which feels amazing to her, and has gotten a car so the transportation  problem is gone.  She talked about her goals for the next year of deleting her social  media, starting a non-profit for girls, having a charity event, and getting her credit together so she can start thinking about buying a house.  She received positive strokes for how well she is doing.  She feels this is because the medication may be starting to work.  She hesitated to take it but did eventually start the Prozac although she has not taken any of the PRN Hydroxyzine.  She finds herself excited in the mornings to go to work and learn more about the medical field.  She has a "friend" but they are not serious.  She stated that she is more accepting of being alone than previously.  She has started on birth control and she has gone off her neurology medicine, stated she is not having any headaches.  She feels that her daughter is being more help at home and is more respectful and she is excited that things have calmed down in her life.  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  She is okay with reducing her visits to every 3 weeks or so.  Suicidal/Homicidal: No  Therapist Response:   Patient is progressing AEB engaging in scheduled therapy session.  Throughout the session, [name] gave patient the opportunity to explore thoughts and feelings associated with current life situations and past/present stressors.   [name] challenged patient gently and appropriately to consider different ways of looking at reported issues. [name] encouraged patient's expression of feelings and validated these using empathy, active listening, open body language, and unconditional positive regard.   [name] encouraged patient to schedule more therapy sessions for the future, as needed.   Recommendations:  Return to therapy every 3-4 weeks depending on what she feels is needed, continue to engage in self care behaviors  Plan: Cancel next appointment in one week, return to following appointment on 12/19, set future appointments  Next appointment 12/6  Diagnosis:  Major depressive disorder, recurrent episode, moderate  (HCC)  Post traumatic stress disorder  Collaboration of Care: Psychiatrist AEB - was referred for doctor appointment  Patient/Guardian was advised Release of Information must be obtained prior to any record release in order to collaborate their care with an outside provider. Patient/Guardian was advised if they have not already done so to contact the registration department to sign all necessary forms in order for [place] to release information regarding their care.   Consent: Patient/Guardian gives verbal consent for treatment and assignment of benefits for services provided during this visit. Patient/Guardian expressed understanding and agreed to proceed.   [name], LCSW 01/14/2023

## 2023-01-18 ENCOUNTER — Other Ambulatory Visit: Payer: Medicaid Other

## 2023-01-18 ENCOUNTER — Encounter: Payer: Self-pay | Admitting: Gastroenterology

## 2023-01-18 ENCOUNTER — Ambulatory Visit (INDEPENDENT_AMBULATORY_CARE_PROVIDER_SITE_OTHER): Payer: No Typology Code available for payment source | Admitting: Gastroenterology

## 2023-01-18 VITALS — BP 120/82 | HR 76 | Ht 64.0 in | Wt 181.6 lb

## 2023-01-18 DIAGNOSIS — R1310 Dysphagia, unspecified: Secondary | ICD-10-CM | POA: Diagnosis not present

## 2023-01-18 DIAGNOSIS — K5900 Constipation, unspecified: Secondary | ICD-10-CM

## 2023-01-18 DIAGNOSIS — R142 Eructation: Secondary | ICD-10-CM

## 2023-01-18 NOTE — Patient Instructions (Signed)
Please go to the lab today to have TSH drawn.  Will plan to schedule EGD and colonoscopy with Dr. Lavon Paganini.

## 2023-01-18 NOTE — Progress Notes (Signed)
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  She presents today for evaluation of some GI complaints.  Has limited past medical history as listed below.  She says that she had issues on a regular basis where food and sometimes pills will get stuck as she is swallowing.  She says that she will drink water to make them go down.  No issues with drinking liquid.  Says that she has a lot of belching and bloating.  She is on pantoprazole 40 mg daily for the past several months.  That does help a lot with acid reflux.  Has never had an EGD in the past.  Also has longstanding constipation.  Says that it has been like this for a while, years.  She says that sometimes she has good weeks, but for the most part she just will have small amounts of bowel movements at a time that are balls of stool.  Sometimes are mushy.  She has not taken anything for the constipation regularly.  Denies seeing blood in her stool.  Has some left lower quadrant cramping.   Past Medical History:  Diagnosis Date   Anemia    Anxiety    Asthma    Chlamydia    Depression    suicide att in 2007   Hx of varicella    Normal pregnancy in multigravida in third trimester 01/09/2015   Pseudotumor cerebri    PTSD (post-traumatic stress disorder)    Past Surgical History:  Procedure Laterality Date   DILATION AND CURETTAGE OF UTERUS     TONSILLECTOMY     WISDOM TOOTH EXTRACTION      reports that she has never smoked. She has never used smokeless tobacco. She reports current alcohol use. She reports that she does not use drugs. family history includes Cancer in her father; Depression in her maternal aunt; Heart disease in her mother; Hypertension in her maternal aunt, maternal grandmother, and mother; Stroke in her maternal aunt. Allergies  Allergen Reactions   Amoxicillin Itching   Tape Rash      Outpatient Encounter  Medications as of 01/18/2023  Medication Sig   chlorthalidone (HYGROTON) 25 MG tablet Take 1 tablet (25 mg total) by mouth daily.   FLUoxetine (PROZAC) 10 MG capsule Take 1 capsule (10 mg total) by mouth daily.   hydrOXYzine (ATARAX) 10 MG tablet Take 1 tablet (10 mg total) by mouth 3 (three) times daily as needed.   pantoprazole (PROTONIX) 40 MG tablet Take 1 tablet (40 mg total) by mouth daily.   [DISCONTINUED] acetaZOLAMIDE (DIAMOX) 250 MG tablet Take by mouth.   [DISCONTINUED] albuterol (PROVENTIL HFA;VENTOLIN HFA) 108 (90 BASE) MCG/ACT inhaler Inhale 2 puffs into the lungs every 6 (six) hours as needed for wheezing or shortness of breath.   [DISCONTINUED] D3-50 1.25 MG (50000 UT) capsule Take 50,000 Units by mouth once a week. gyn   [DISCONTINUED] trimethoprim-polymyxin b (POLYTRIM) ophthalmic solution Place 1 drop into the right eye in the morning, at noon, in the evening, and at bedtime.   No facility-administered encounter medications on file as of 01/18/2023.    REVIEW OF SYSTEMS  : All other systems reviewed and negative except where noted in the History of Present Illness.   PHYSICAL EXAM: BP 120/82   Pulse 76   Ht 5\' 4"  (1.626 m)   Wt 181 lb 9.6 oz (82.4 kg)  LMP 12/26/2022 (Exact Date)   SpO2 98%   BMI 31.17 kg/m  General: Well developed female in no acute distress Head: Normocephalic and atraumatic Eyes:  Sclerae anicteric, conjunctiva pink. Ears: Normal auditory acuity Lungs: Clear throughout to auscultation; no W/R/R. Heart: Regular rate and rhythm; no M/R/G. Abdomen: Soft, non-distended.  BS present.  Non-tender. Rectal:  Will be done at the time of colonoscopy. Musculoskeletal: Symmetrical with no gross deformities  Skin: No lesions on visible extremities Extremities: No edema  Neurological: Alert oriented x 4, grossly non-focal Psychological:  Alert and cooperative. Normal mood and affect  ASSESSMENT AND PLAN: *Constipation: Has been present for quite some  time.  Likely functional, IBS or idiopathic constipation.  TSH has not been checked in almost 2 years so we will check that.  Will plan for colonoscopy with Dr. Lavon Paganini.  Likely will just need to try something on a regular basis such as MiraLAX, Benefiber, low-dose Linzess. *Dysphagia: Intermittently has food and pills get stuck.  Goes down with drinking water.  Takes pantoprazole daily for reflux.  Also describes a lot of belching.  Plan for EGD with possible dilation with Dr. Lavon Paganini.    **The risks, benefits, and alternatives to EGD with dilation and colonoscopy were discussed with the patient and she consents to proceed.   CC:  Duanne Limerick, MD

## 2023-01-18 NOTE — Addendum Note (Signed)
Addended by: Mariane Duval on: 01/18/2023 01:49 PM   Modules accepted: Orders

## 2023-01-19 ENCOUNTER — Other Ambulatory Visit (INDEPENDENT_AMBULATORY_CARE_PROVIDER_SITE_OTHER): Payer: Medicaid Other

## 2023-01-19 ENCOUNTER — Telehealth: Payer: Self-pay

## 2023-01-19 ENCOUNTER — Telehealth: Payer: Self-pay | Admitting: *Deleted

## 2023-01-19 DIAGNOSIS — K5900 Constipation, unspecified: Secondary | ICD-10-CM | POA: Diagnosis not present

## 2023-01-19 LAB — TSH: TSH: 1.68 u[IU]/mL (ref 0.35–5.50)

## 2023-01-19 NOTE — Addendum Note (Signed)
Addended by: Mariane Duval on: 01/19/2023 04:35 PM   Modules accepted: Orders

## 2023-01-19 NOTE — Telephone Encounter (Signed)
Dr Lavon Paganini you had an opening come up for December 13th so I have moved Denise's appointment up from 12/30 since she is symptomatic. Wanted to let you know.

## 2023-01-19 NOTE — Telephone Encounter (Signed)
-----   Message from Crescent D. Zehr sent at 01/19/2023  4:28 PM EST -----  ----- Message ----- From: Napoleon Form, MD Sent: 01/19/2023   4:25 PM EST To: Leta Baptist, PA-C  Ok, its good ----- Message ----- From: Leta Baptist, PA-C Sent: 01/18/2023  11:43 AM EST To: Mariane Duval, CMA; Napoleon Form, MD  I saw Angelique Blonder as a patient today (our CMA).  She is going to get an EGD/colon with you.  You have a spot on 12/30.  Would you be willing to add her in as a double to that spot that day?  She said that you told her that you could work her in by the end of the year.  Thank you,  Jess

## 2023-01-20 NOTE — Telephone Encounter (Signed)
Agree. Thanks

## 2023-01-21 ENCOUNTER — Encounter: Payer: Self-pay | Admitting: Gastroenterology

## 2023-01-21 ENCOUNTER — Ambulatory Visit (HOSPITAL_COMMUNITY): Payer: Medicaid Other | Admitting: Clinical

## 2023-01-21 ENCOUNTER — Ambulatory Visit: Payer: No Typology Code available for payment source | Admitting: Gastroenterology

## 2023-01-21 VITALS — BP 136/81 | HR 67 | Temp 97.5°F | Resp 14 | Ht 64.0 in | Wt 181.0 lb

## 2023-01-21 DIAGNOSIS — R1310 Dysphagia, unspecified: Secondary | ICD-10-CM

## 2023-01-21 DIAGNOSIS — K2990 Gastroduodenitis, unspecified, without bleeding: Secondary | ICD-10-CM

## 2023-01-21 DIAGNOSIS — K3189 Other diseases of stomach and duodenum: Secondary | ICD-10-CM | POA: Diagnosis not present

## 2023-01-21 DIAGNOSIS — D125 Benign neoplasm of sigmoid colon: Secondary | ICD-10-CM

## 2023-01-21 DIAGNOSIS — K644 Residual hemorrhoidal skin tags: Secondary | ICD-10-CM | POA: Diagnosis not present

## 2023-01-21 DIAGNOSIS — K529 Noninfective gastroenteritis and colitis, unspecified: Secondary | ICD-10-CM | POA: Diagnosis not present

## 2023-01-21 DIAGNOSIS — K219 Gastro-esophageal reflux disease without esophagitis: Secondary | ICD-10-CM | POA: Diagnosis not present

## 2023-01-21 DIAGNOSIS — K2970 Gastritis, unspecified, without bleeding: Secondary | ICD-10-CM | POA: Diagnosis not present

## 2023-01-21 DIAGNOSIS — K5900 Constipation, unspecified: Secondary | ICD-10-CM

## 2023-01-21 DIAGNOSIS — R1084 Generalized abdominal pain: Secondary | ICD-10-CM

## 2023-01-21 DIAGNOSIS — K648 Other hemorrhoids: Secondary | ICD-10-CM

## 2023-01-21 MED ORDER — SODIUM CHLORIDE 0.9 % IV SOLN: 500.0000 mL | INTRAVENOUS | Status: DC

## 2023-01-21 NOTE — Patient Instructions (Addendum)
Resume all of your previous medications as ordered.  Read your discharge instructions.  You need a soft diet until 4 pm.  Then, a regular diet is fine.  YOU HAD AN ENDOSCOPIC PROCEDURE TODAY AT THE [facility]:   Refer to the procedure report that was given to you for any specific questions about what was found during the examination.  If the procedure report does not answer your questions, please call your gastroenterologist to clarify.  If you requested that your care partner not be given the details of your procedure findings, then the procedure report has been included in a sealed envelope for you to review at your convenience later.  YOU SHOULD EXPECT: Some feelings of bloating in the abdomen. Passage of more gas than usual.  Walking can help get rid of the air that was put into your GI tract during the procedure and reduce the bloating. If you had a lower endoscopy (such as a colonoscopy or flexible sigmoidoscopy) you may notice spotting of blood in your stool or on the toilet paper. If you underwent a bowel prep for your procedure, you may not have a normal bowel movement for a few days.  Please Note:  You might notice some irritation and congestion in your nose or some drainage.  This is from the oxygen used during your procedure.  There is no need for concern and it should clear up in a day or so.  SYMPTOMS TO REPORT IMMEDIATELY:  Following lower endoscopy (colonoscopy or flexible sigmoidoscopy):  Excessive amounts of blood in the stool  Significant tenderness or worsening of abdominal pains  Swelling of the abdomen that is new, acute  Fever of 100F or higher  Following upper endoscopy (EGD)  Vomiting of blood or coffee ground material  New chest pain or pain under the shoulder blades  Painful or persistently difficult swallowing  New shortness of breath  Fever of 100F or higher  Black, tarry-looking stools  For urgent or emergent issues, a gastroenterologist can be  reached at any hour by calling [phone]. Do not use MyChart messaging for urgent concerns.    DIET:  We do recommend soft diet until 4 pm.  Then you may proceed to your regular diet.  Drink plenty of fluids but you should avoid alcoholic beverages for 24 hours.  ACTIVITY:  You should plan to take it easy for the rest of today and you should NOT DRIVE or use heavy machinery until tomorrow (because of the sedation medicines used during the test).    FOLLOW UP: Our staff will call the number listed on your records the next business day following your procedure.  We will call around 7:15- 8:00 am to check on you and address any questions or concerns that you may have regarding the information given to you following your procedure. If we do not reach you, we will leave a message.     If any biopsies were taken you will be contacted by phone or by letter within the next 1-3 weeks.  Please call us at [phone] if you have not heard about the biopsies in 3 weeks.    SIGNATURES/CONFIDENTIALITY: You and/or your care partner have signed paperwork which will be entered into your electronic medical record.  These signatures attest to the fact that that the information above on your After Visit Summary has been reviewed and is understood.  Full responsibility of the confidentiality of this discharge information lies with you and/or your care-partner.

## 2023-01-21 NOTE — Op Note (Signed)
[facility] Patient Name: [name] Procedure Date: 01/21/2023 2:04 PM MRN: [id number] Endoscopist: Napoleon Form , MD, [id number] Age: 37 Referring MD:  Date of Birth: 1985/09/07 Gender: Female Account #: [id number] Procedure:                Colonoscopy Indications:              Generalized abdominal pain, Change in bowel habits,                            Constipation Medicines:                Monitored Anesthesia Care Procedure:                Pre-Anesthesia Assessment:                           - Prior to the procedure, a History and Physical                            was performed, and patient medications and                            allergies were reviewed. The patient's tolerance of                            previous anesthesia was also reviewed. The risks                            and benefits of the procedure and the sedation                            options and risks were discussed with the patient.                            All questions were answered, and informed consent                            was obtained. Prior Anticoagulants: The patient has                            taken no anticoagulant or antiplatelet agents. ASA                            Grade Assessment: II - A patient with mild systemic                            disease. After reviewing the risks and benefits,                            the patient was deemed in satisfactory condition to                            undergo the procedure.  After obtaining informed consent, the colonoscope                            was passed under direct vision. Throughout the                            procedure, the patient's blood pressure, pulse, and                            oxygen saturations were monitored continuously. The                            Olympus Scope [id number] was introduced through the                            anus and advanced to the the  terminal ileum, with                            identification of the appendiceal orifice and IC                            valve. The colonoscopy was performed without                            difficulty. The patient tolerated the procedure                            well. The quality of the bowel preparation was                            good. The terminal ileum, ileocecal valve,                            appendiceal orifice, and rectum were photographed. Scope In: 2:30:25 PM Scope Out: 2:46:37 PM Scope Withdrawal Time: 0 hours 12 minutes 25 seconds  Total Procedure Duration: 0 hours 16 minutes 12 seconds  Findings:                 The perianal and digital rectal examinations were                            normal.                           Patchy mild inflammation characterized by                            congestion (edema), erosions and erythema was found                            in the terminal ileum. Biopsies were taken with a                            cold forceps for histology.  A 10 mm polyp was found in the sigmoid colon. The                            polyp was semi-pedunculated. The polyp was removed                            with a hot snare. Resection and retrieval were                            complete.                           Non-bleeding external and internal hemorrhoids were                            found during retroflexion. The hemorrhoids were                            small. Complications:            No immediate complications. Estimated Blood Loss:     Estimated blood loss was minimal. Impression:               - Mild inflammation was found in the ileum                            secondary to ileitis. Biopsied.                           - One 10 mm polyp in the sigmoid colon, removed                            with a hot snare. Resected and retrieved.                           - Non-bleeding external and internal  hemorrhoids. Recommendation:           - Patient has a contact number available for                            emergencies. The signs and symptoms of potential                            delayed complications were discussed with the                            patient. Return to normal activities tomorrow.                            Written discharge instructions were provided to the                            patient.                           - Resume previous diet.                           -  Continue present medications.                           - Await pathology results.                           - Repeat colonoscopy in 3 - 10 years for                            surveillance based on pathology results. Napoleon Form, MD 01/21/2023 2:54:58 PM This report has been signed electronically.

## 2023-01-21 NOTE — Progress Notes (Signed)
Report to PACU, RN, vss, BBS= Clear.  

## 2023-01-21 NOTE — Progress Notes (Signed)
Pt's states no medical or surgical changes since previsit or office visit. 

## 2023-01-21 NOTE — Op Note (Signed)
[facility] Patient Name: [name] Procedure Date: 01/21/2023 2:06 PM MRN: [id number] Endoscopist: Napoleon Form , MD, [id number] Age: 37 Referring MD:  Date of Birth: 06-Nov-1985 Gender: Female Account #: [id number] Procedure:                Upper GI endoscopy Indications:              Dysphagia Medicines:                Monitored Anesthesia Care Procedure:                Pre-Anesthesia Assessment:                           - Prior to the procedure, a History and Physical                            was performed, and patient medications and                            allergies were reviewed. The patient's tolerance of                            previous anesthesia was also reviewed. The risks                            and benefits of the procedure and the sedation                            options and risks were discussed with the patient.                            All questions were answered, and informed consent                            was obtained. Prior Anticoagulants: The patient has                            taken no anticoagulant or antiplatelet agents. ASA                            Grade Assessment: II - A patient with mild systemic                            disease. After reviewing the risks and benefits,                            the patient was deemed in satisfactory condition to                            undergo the procedure.                           After obtaining informed consent, the endoscope was  passed under direct vision. Throughout the                            procedure, the patient's blood pressure, pulse, and                            oxygen saturations were monitored continuously. The                            GIF [id number] was introduced through the                            mouth, and advanced to the second part of duodenum.                            The upper GI endoscopy was  accomplished without                            difficulty. The patient tolerated the procedure                            well. Scope In: Scope Out: Findings:                 No endoscopic abnormality was evident in the                            esophagus to explain the patient's complaint of                            dysphagia. It was decided, however, to proceed with                            dilation of the entire esophagus. The scope was                            withdrawn. Dilation was performed with a Maloney                            dilator with no resistance at 48 Fr. The dilation                            site was examined following endoscope reinsertion                            and showed no change. Biopsies were obtained from                            the proximal and distal esophagus with cold forceps                            for histology of suspected eosinophilic esophagitis.  The Z-line was regular and was found 38 cm from the                            incisors.                           Patchy mild inflammation characterized by                            congestion (edema) and erythema was found in the                            entire examined stomach. Biopsies were taken with a                            cold forceps for Helicobacter pylori testing.                           The cardia and gastric fundus were normal on                            retroflexion.                           Patchy mildly erythematous mucosa was found in the                            duodenal bulb, in the first portion of the duodenum                            and in the second portion of the duodenum. Biopsies                            for histology were taken with a cold forceps for                            evaluation of celiac disease. Complications:            No immediate complications. Estimated Blood Loss:     Estimated blood loss was  minimal. Impression:               - No endoscopic esophageal abnormality to explain                            patient's dysphagia. Esophagus dilated. Dilated.                           - Z-line regular, 38 cm from the incisors.                           - Gastritis. Biopsied.                           - Erythematous duodenopathy. Biopsied.                           -  Biopsies were taken with a cold forceps for                            evaluation of eosinophilic esophagitis. Recommendation:           - Patient has a contact number available for                            emergencies. The signs and symptoms of potential                            delayed complications were discussed with the                            patient. Return to normal activities tomorrow.                            Written discharge instructions were provided to the                            patient.                           - Resume previous diet.                           - Continue present medications.                           - Await pathology results. Napoleon Form, MD 01/21/2023 2:57:17 PM This report has been signed electronically.

## 2023-01-21 NOTE — Progress Notes (Signed)
Called to room to assist during endoscopic procedure.  Patient ID and intended procedure confirmed with present staff. Received instructions for my participation in the procedure from the performing physician.  

## 2023-01-21 NOTE — Progress Notes (Signed)
[facility] History and Physical   Primary Care Physician:  Duanne Limerick, MD   Reason for Procedure:  Dysphagia, abdominal pain, change in bowel habits with worsening constipation  Plan:    EGD and colonoscopy with possible interventions as needed     HPI: [name] is a very pleasant 37 y.o. female here for EGD and colonoscopy for evaluation of dysphagia, abdominal pain, change in bowel habits with worsening constipation   The risks and benefits as well as alternatives of endoscopic procedure(s) have been discussed and reviewed. All questions answered. The patient agrees to proceed.    Past Medical History:  Diagnosis Date   Anemia    Anxiety    Asthma    Chlamydia    Depression    suicide att in 2007   Hx of varicella    Normal pregnancy in multigravida in third trimester 01/09/2015   Pseudotumor cerebri    PTSD (post-traumatic stress disorder)     Past Surgical History:  Procedure Laterality Date   DILATION AND CURETTAGE OF UTERUS     TONSILLECTOMY     WISDOM TOOTH EXTRACTION      Prior to Admission medications   Medication Sig Start Date End Date Taking? Authorizing Provider  chlorthalidone (HYGROTON) 25 MG tablet Take 1 tablet (25 mg total) by mouth daily. 01/11/23  Yes Duanne Limerick, MD  FLUoxetine (PROZAC) 10 MG capsule Take 1 capsule (10 mg total) by mouth daily. 12/06/22  Yes Oneta Rack, NP  pantoprazole (PROTONIX) 40 MG tablet Take 1 tablet (40 mg total) by mouth daily. 10/14/22  Yes Duanne Limerick, MD  hydrOXYzine (ATARAX) 10 MG tablet Take 1 tablet (10 mg total) by mouth 3 (three) times daily as needed. 12/06/22   Oneta Rack, NP    Current Outpatient Medications  Medication Sig Dispense Refill   chlorthalidone (HYGROTON) 25 MG tablet Take 1 tablet (25 mg total) by mouth daily. 30 tablet 0   FLUoxetine (PROZAC) 10 MG capsule Take 1 capsule (10 mg total) by mouth daily. 60 capsule 0   pantoprazole (PROTONIX) 40 MG  tablet Take 1 tablet (40 mg total) by mouth daily. 30 tablet 2   hydrOXYzine (ATARAX) 10 MG tablet Take 1 tablet (10 mg total) by mouth 3 (three) times daily as needed. 30 tablet 0   Current Facility-Administered Medications  Medication Dose Route Frequency Provider Last Rate Last Admin   0.9 %  sodium chloride infusion  500 mL Intravenous Continuous Jalisia Puchalski V, MD        Allergies as of 01/21/2023 - Review Complete 01/21/2023  Allergen Reaction Noted   Amoxicillin Itching 09/08/2019   Tape Rash 09/08/2019    Family History  Problem Relation Age of Onset   Heart disease Mother    Hypertension Mother    Cancer Father    Depression Maternal Aunt    Hypertension Maternal Aunt    Stroke Maternal Aunt    Hypertension Maternal Grandmother    Alcohol abuse Neg Hx    Arthritis Neg Hx    Asthma Neg Hx    Birth defects Neg Hx    COPD Neg Hx    Diabetes Neg Hx    Drug abuse Neg Hx    Early death Neg Hx    Hearing loss Neg Hx    Hyperlipidemia Neg Hx    Kidney disease Neg Hx    Learning disabilities Neg Hx    Mental illness Neg Hx  Mental retardation Neg Hx    Miscarriages / Stillbirths Neg Hx    Vision loss Neg Hx    Varicose Veins Neg Hx     Social History   Socioeconomic History   Marital status: Single    Spouse name: Not on file   Number of children: Not on file   Years of education: Not on file   Highest education level: Not on file  Occupational History   Not on file  Tobacco Use   Smoking status: Never   Smokeless tobacco: Never  Vaping Use   Vaping status: Never Used  Substance and Sexual Activity   Alcohol use: Yes    Comment: occ   Drug use: No   Sexual activity: Yes  Other Topics Concern   Not on file  Social History Narrative   Not on file   Social Drivers of Health   Financial Resource Strain: Not on file  Food Insecurity: Not on file  Transportation Needs: Not on file  Physical Activity: Not on file  Stress: Not on file  Social  Connections: Not on file  Intimate Partner Violence: Not on file    Review of Systems:  All other review of systems negative except as mentioned in the HPI.  Physical Exam: Vital signs in last 24 hours: BP 137/86   Pulse 82   Temp (!) 97.5 F (36.4 C) (Temporal)   Ht 5\' 4"  (1.626 m)   Wt 181 lb (82.1 kg)   LMP 12/26/2022 (Exact Date)   SpO2 97%   BMI 31.07 kg/m  General:   Alert, NAD Lungs:  Clear .   Heart:  Regular rate and rhythm Abdomen:  Soft, nontender and nondistended. Neuro/Psych:  Alert and cooperative. Normal mood and affect. A and O x 3  Reviewed labs, radiology imaging, old records and pertinent past GI work up  Patient is appropriate for planned procedure(s) and anesthesia in an ambulatory setting   K. Scherry Ran , MD [phone]

## 2023-01-24 ENCOUNTER — Other Ambulatory Visit (HOSPITAL_COMMUNITY): Payer: Self-pay

## 2023-01-24 ENCOUNTER — Telehealth: Payer: Self-pay | Admitting: *Deleted

## 2023-01-24 NOTE — Telephone Encounter (Signed)
Left message on f/u call 

## 2023-01-26 LAB — SURGICAL PATHOLOGY

## 2023-01-27 ENCOUNTER — Ambulatory Visit (INDEPENDENT_AMBULATORY_CARE_PROVIDER_SITE_OTHER): Payer: Medicaid Other | Admitting: Clinical

## 2023-01-27 ENCOUNTER — Encounter (HOSPITAL_COMMUNITY): Payer: Self-pay | Admitting: Clinical

## 2023-01-27 DIAGNOSIS — F411 Generalized anxiety disorder: Secondary | ICD-10-CM

## 2023-01-27 DIAGNOSIS — F4310 Post-traumatic stress disorder, unspecified: Secondary | ICD-10-CM

## 2023-01-27 DIAGNOSIS — F410 Panic disorder [episodic paroxysmal anxiety] without agoraphobia: Secondary | ICD-10-CM

## 2023-01-27 DIAGNOSIS — F331 Major depressive disorder, recurrent, moderate: Secondary | ICD-10-CM

## 2023-01-27 NOTE — Progress Notes (Signed)
   THERAPIST PROGRESS NOTE   Patient attended the virtual session for only about 3 minutes to update [name] on how she is doing.  She was busy in a clinic and stated she could not meet, actually did not need to meet today.  She stated she is "doing very well, happy."  She was able to get her children everything she had wanted to for Christmas, which was a first time event for her.  She got a raise at work and her benefits will start in January.  She feels the medications are working just like they are supposed to.  She realizes she has not scheduled additional appointments as was recommended at last session, assured she will call in today to do so.  She was told about the upcoming group to help fill the span between appointments and was interested.  We extended holiday wishes and signed off.  Virtual Visit via Video Note  I connected with [name] on 01/27/23 at 11:00 AM EST by a video enabled telemedicine application and verified that I am speaking with the correct person using two identifiers.  Location: Patient: work in [place] Provider: [facility] outpatient therapy office   I discussed the limitations of evaluation and management by telemedicine and the availability of in person appointments. The patient expressed understanding and agreed to proceed.  I discussed the assessment and treatment plan with the patient. The patient was provided an opportunity to ask questions and all were answered. The patient agreed with the plan and demonstrated an understanding of the instructions.   The patient was advised to call back or seek an in-person evaluation if the symptoms worsen or if the condition fails to improve as anticipated.  I provided 3 minutes of non-face-to-face time during this encounter.  [name], LCSW   Encounter Diagnoses  Name Primary?   Major depressive disorder, recurrent episode, moderate (HCC) Yes   Post traumatic stress disorder    Generalized  anxiety disorder with panic attacks      [name], LCSW 01/27/2023

## 2023-02-07 ENCOUNTER — Telehealth (HOSPITAL_COMMUNITY): Payer: Self-pay | Admitting: Clinical

## 2023-02-07 ENCOUNTER — Encounter: Payer: Medicaid Other | Admitting: Gastroenterology

## 2023-02-10 ENCOUNTER — Other Ambulatory Visit: Payer: Self-pay | Admitting: Obstetrics and Gynecology

## 2023-02-10 DIAGNOSIS — N6320 Unspecified lump in the left breast, unspecified quadrant: Secondary | ICD-10-CM

## 2023-02-11 ENCOUNTER — Telehealth: Payer: Medicaid Other | Admitting: Family Medicine

## 2023-02-15 ENCOUNTER — Telehealth: Payer: Self-pay | Admitting: Family Medicine

## 2023-02-15 NOTE — Telephone Encounter (Signed)
 Copied from CRM [id number]. Topic: Referral - Request for Referral >> Feb 15, 2023 11:41 AM Everette C wrote: Did the patient discuss referral with their provider in the last year? No (If No - schedule appointment) (If Yes - send message)  Appointment offered? 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 Fargen / [place] / [phone]   If referral order, have you been seen by this specialty before? Yes (If Yes, this issue or another issue? When? Where?  Can we respond through MyChart? No

## 2023-02-17 ENCOUNTER — Other Ambulatory Visit: Payer: Self-pay

## 2023-02-17 ENCOUNTER — Other Ambulatory Visit (HOSPITAL_COMMUNITY): Payer: Self-pay

## 2023-02-17 ENCOUNTER — Other Ambulatory Visit: Payer: Self-pay | Admitting: Family Medicine

## 2023-02-17 DIAGNOSIS — K219 Gastro-esophageal reflux disease without esophagitis: Secondary | ICD-10-CM

## 2023-02-18 ENCOUNTER — Other Ambulatory Visit (HOSPITAL_BASED_OUTPATIENT_CLINIC_OR_DEPARTMENT_OTHER): Payer: Self-pay

## 2023-02-18 ENCOUNTER — Other Ambulatory Visit (HOSPITAL_COMMUNITY): Payer: Self-pay

## 2023-02-18 MED ORDER — PANTOPRAZOLE SODIUM 40 MG PO TBEC
40.0000 mg | DELAYED_RELEASE_TABLET | Freq: Every day | ORAL | 2 refills | Status: DC
  Filled 2023-02-18: qty 30, 30d supply, fill #0
  Filled 2023-03-15: qty 30, 30d supply, fill #1
  Filled 2023-04-26: qty 30, 30d supply, fill #2

## 2023-02-20 ENCOUNTER — Encounter: Payer: Self-pay | Admitting: Gastroenterology

## 2023-03-04 ENCOUNTER — Ambulatory Visit
Admission: RE | Admit: 2023-03-04 | Discharge: 2023-03-04 | Disposition: A | Discharge status: Home / Self Care | Payer: Medicaid Other | Source: Ambulatory Visit | Attending: Obstetrics and Gynecology | Admitting: Obstetrics and Gynecology

## 2023-03-04 DIAGNOSIS — N6320 Unspecified lump in the left breast, unspecified quadrant: Secondary | ICD-10-CM

## 2023-03-10 ENCOUNTER — Other Ambulatory Visit (HOSPITAL_COMMUNITY): Payer: Self-pay | Admitting: Family

## 2023-03-10 ENCOUNTER — Other Ambulatory Visit (HOSPITAL_COMMUNITY): Payer: Self-pay

## 2023-03-14 ENCOUNTER — Encounter: Payer: Self-pay | Admitting: Family Medicine

## 2023-03-14 ENCOUNTER — Ambulatory Visit
Admission: RE | Admit: 2023-03-14 | Discharge: 2023-03-14 | Disposition: A | Discharge status: Home / Self Care | Payer: Medicaid Other | Attending: Family Medicine | Admitting: Family Medicine

## 2023-03-14 ENCOUNTER — Ambulatory Visit
Admission: RE | Admit: 2023-03-14 | Discharge: 2023-03-14 | Disposition: A | Discharge status: Home / Self Care | Payer: Medicaid Other | Source: Ambulatory Visit | Attending: Family Medicine | Admitting: Family Medicine

## 2023-03-14 ENCOUNTER — Ambulatory Visit: Payer: Self-pay | Admitting: Family Medicine

## 2023-03-14 ENCOUNTER — Other Ambulatory Visit (HOSPITAL_COMMUNITY): Payer: Self-pay

## 2023-03-14 VITALS — BP 112/72 | HR 86 | Ht 64.0 in | Wt 183.0 lb

## 2023-03-14 DIAGNOSIS — Z01818 Encounter for other preprocedural examination: Secondary | ICD-10-CM

## 2023-03-14 DIAGNOSIS — X838XXA Intentional self-harm by other specified means, initial encounter: Secondary | ICD-10-CM | POA: Insufficient documentation

## 2023-03-14 DIAGNOSIS — J45909 Unspecified asthma, uncomplicated: Secondary | ICD-10-CM | POA: Insufficient documentation

## 2023-03-14 DIAGNOSIS — F418 Other specified anxiety disorders: Secondary | ICD-10-CM | POA: Insufficient documentation

## 2023-03-14 MED ORDER — CHLORTHALIDONE 25 MG PO TABS
25.0000 mg | ORAL_TABLET | Freq: Every day | ORAL | 0 refills | Status: DC
  Filled 2023-03-14: qty 30, 30d supply, fill #0

## 2023-03-14 NOTE — Progress Notes (Signed)
Date:  03/14/2023   Name:  [name]   DOB:  11-Sep-1985   MRN:  [id number]   Chief Complaint: Pre-op Exam (Scheduled for 04/06/2022 for Tummy Tuck with Dr Hortencia Pilar in [place], [place]. F: [phone], PH: [phone])  Patient is a 38 year old female who presents for a medical clearnce for surgery exam. The patient reports the following problems: hypertension/gerd/idiopathic intracranial hypertension. Health maintenance has been reviewed up to date.      Lab Results  Component Value Date   NA 135 10/17/2022   K 3.3 (L) 10/17/2022   CO2 20 (L) 10/17/2022   GLUCOSE 82 10/17/2022   BUN 19 10/17/2022   CREATININE 1.12 (H) 10/17/2022   CALCIUM 9.6 10/17/2022   GFRNONAA >60 10/17/2022   No results found for: "CHOL", "HDL", "LDLCALC", "LDLDIRECT", "TRIG", "CHOLHDL" Lab Results  Component Value Date   TSH 1.68 01/19/2023   No results found for: "HGBA1C" Lab Results  Component Value Date   WBC 12.1 (H) 10/17/2022   HGB 14.6 10/17/2022   HCT 42.6 10/17/2022   MCV 88.9 10/17/2022   PLT 361 10/17/2022   Lab Results  Component Value Date   ALT 10 10/17/2022   AST 15 10/17/2022   ALKPHOS 57 10/17/2022   BILITOT 1.0 10/17/2022   No results found for: "25OHVITD2", "25OHVITD3", "VD25OH"   Review of Systems  Constitutional:  Negative for diaphoresis, fatigue and unexpected weight change.  HENT:  Negative for trouble swallowing.   Eyes:  Negative for visual disturbance.  Respiratory:  Negative for chest tightness, shortness of breath and wheezing.   Cardiovascular:  Negative for chest pain, palpitations and leg swelling.  Gastrointestinal:  Negative for abdominal distention, abdominal pain and blood in stool.  Endocrine: Negative for polydipsia and polyuria.  Genitourinary:  Negative for difficulty urinating, hematuria and vaginal bleeding.  Neurological:  Negative for dizziness, tremors, weakness, numbness and headaches.  Hematological:  Negative for  adenopathy. Does not bruise/bleed easily.    Patient Active Problem List   Diagnosis Date Noted   Mixed anxiety and depressive disorder 03/14/2023   Asthma 03/14/2023   Suicide and self-inflicted injury (HCC) 03/14/2023   Dysphagia 01/18/2023   Belching 01/18/2023   Constipation 01/18/2023   IIH (idiopathic intracranial hypertension) 06/23/2022   Avitaminosis D 11/20/2021   Normal labor and delivery 01/10/2015   SVD (spontaneous vaginal delivery) 01/10/2015   Normal pregnancy in multigravida 01/10/2015   Normal pregnancy in multigravida in third trimester 01/09/2015    Allergies  Allergen Reactions   Amoxicillin Itching   Tape Rash    Past Surgical History:  Procedure Laterality Date   DILATION AND CURETTAGE OF UTERUS     TONSILLECTOMY     WISDOM TOOTH EXTRACTION      Social History   Tobacco Use   Smoking status: Never   Smokeless tobacco: Never  Vaping Use   Vaping status: Never Used  Substance Use Topics   Alcohol use: Yes    Comment: occ   Drug use: No     Medication list has been reviewed and updated.  Current Meds  Medication Sig   chlorthalidone (HYGROTON) 25 MG tablet Take 1 tablet (25 mg total) by mouth daily.   FLUoxetine (PROZAC) 10 MG capsule Take 1 capsule (10 mg total) by mouth daily.   hydrOXYzine (ATARAX) 10 MG tablet Take 1 tablet (10 mg total) by mouth 3 (three) times daily as needed.   pantoprazole (PROTONIX) 40 MG tablet Take 1 tablet (  40 mg total) by mouth daily.   TWIRLA 120-30 MCG/24HR PTWK Place onto the skin.       10/14/2022    3:09 PM 04/14/2022    4:26 PM 12/04/2021    9:33 AM 05/11/2021    2:00 PM  GAD 7 : Generalized Anxiety Score  Nervous, Anxious, on Edge 0  0 0  Control/stop worrying 0  0 2  Worry too much - different things 0  0 2  Trouble relaxing 0  0 2  Restless 0  0 0  Easily annoyed or irritable 0  0 2  Afraid - awful might happen 0  0 1  Total GAD 7 Score 0  0 9  Anxiety Difficulty Not difficult at all  Not  difficult at all Somewhat difficult     Information is confidential and restricted. Go to Review Flowsheets to unlock data.       12/06/2022   12:04 PM 10/14/2022    3:09 PM 04/14/2022    4:23 PM  Depression screen PHQ 2/9  Decreased Interest  0   Down, Depressed, Hopeless  0   PHQ - 2 Score  0   Altered sleeping  0   Tired, decreased energy  0   Change in appetite  0   Feeling bad or failure about yourself   0   Trouble concentrating  0   Moving slowly or fidgety/restless  0   Suicidal thoughts  0   PHQ-9 Score  0   Difficult doing work/chores  Not difficult at all      Information is confidential and restricted. Go to Review Flowsheets to unlock data.    BP Readings from Last 3 Encounters:  03/14/23 112/72  01/21/23 136/81  01/18/23 120/82    Physical Exam Vitals and nursing note reviewed.  Constitutional:      General: She is not in acute distress.    Appearance: She is not diaphoretic.  HENT:     Head: Normocephalic and atraumatic.     Right Ear: Tympanic membrane and external ear normal.     Left Ear: Tympanic membrane and external ear normal.     Nose: Nose normal.     Mouth/Throat:     Pharynx: Oropharynx is clear.  Eyes:     General:        Right eye: No discharge.        Left eye: No discharge.     Conjunctiva/sclera: Conjunctivae normal.     Pupils: Pupils are equal, round, and reactive to light.  Neck:     Thyroid: No thyromegaly.     Vascular: No JVD.  Cardiovascular:     Rate and Rhythm: Normal rate and regular rhythm.     Heart sounds: Normal heart sounds and S1 normal. No murmur heard.    No systolic murmur is present.     No diastolic murmur is present.     No friction rub. No gallop. No S3 or S4 sounds.  Pulmonary:     Effort: Pulmonary effort is normal.     Breath sounds: Normal breath sounds. No wheezing, rhonchi or rales.  Abdominal:     General: Bowel sounds are normal.     Palpations: Abdomen is soft. There is no hepatomegaly,  splenomegaly or mass.     Tenderness: There is no abdominal tenderness. There is no guarding.  Musculoskeletal:        General: Normal range of motion.     Cervical back: Normal  range of motion and neck supple.     Right lower leg: No edema.     Left lower leg: No edema.  Lymphadenopathy:     Cervical: No cervical adenopathy.  Skin:    General: Skin is warm and dry.  Neurological:     Mental Status: She is alert.     Deep Tendon Reflexes: Reflexes are normal and symmetric.     Wt Readings from Last 3 Encounters:  03/14/23 183 lb (83 kg)  01/21/23 181 lb (82.1 kg)  01/18/23 181 lb 9.6 oz (82.4 kg)    BP 112/72   Pulse 86   Ht 5\' 4"  (1.626 m)   Wt 183 lb (83 kg)   LMP 03/04/2023   SpO2 100%   BMI 31.41 kg/m   Assessment and Plan:  1. Pre-op examination (Primary) Patient presents for evaluation prior to tummy tuck surgery in [place].  Multiple tests have been requested by the surgical base and we will proceed with this evaluation as directed.  Patient has been told that there is a possibility that all test will not be paid by insurance and that I would not be in a position to prior authorize some of the request.  We are awaiting also that we will ask that there be a clearance by behavioral medicine that she is seeing currently and is on Prozac and that she will need clearance from her neurologist for her idiopathic intracranial hypertension in essence to just to get an okay for proceeding with surgery. - EKG 12-Lead - CBC with Differential/Platelet - Comprehensive metabolic panel - Hemoglobin A1c - hCG, serum, qualitative - HIV Antibody (routine testing w rflx) - PT and PTT - Protime-INR - Thyroid Panel With TSH - Prealbumin - DG Chest 2 View  EKG was done with the following results.  Sinus rhythm rate 78 intervals are normal there is no LVH criteria met by voltage there is no ischemic changes noted such as Q waves, ST-T wave changes nor delay in R wave  progression.  Elizabeth Sauer, MD

## 2023-03-15 ENCOUNTER — Other Ambulatory Visit (HOSPITAL_COMMUNITY): Payer: Self-pay

## 2023-03-15 ENCOUNTER — Encounter: Payer: Self-pay | Admitting: Family Medicine

## 2023-03-15 LAB — CBC WITH DIFFERENTIAL/PLATELET
Basophils Absolute: 0.1 10*3/uL (ref 0.0–0.2)
Basos: 1 %
EOS (ABSOLUTE): 0.1 10*3/uL (ref 0.0–0.4)
Eos: 1 %
Hematocrit: 35.6 % (ref 34.0–46.6)
Hemoglobin: 12.1 g/dL (ref 11.1–15.9)
Immature Grans (Abs): 0 10*3/uL (ref 0.0–0.1)
Immature Granulocytes: 0 %
Lymphocytes Absolute: 2.9 10*3/uL (ref 0.7–3.1)
Lymphs: 27 %
MCH: 30.7 pg (ref 26.6–33.0)
MCHC: 34 g/dL (ref 31.5–35.7)
MCV: 90 fL (ref 79–97)
Monocytes Absolute: 0.5 10*3/uL (ref 0.1–0.9)
Monocytes: 5 %
Neutrophils Absolute: 7.1 10*3/uL — ABNORMAL HIGH (ref 1.4–7.0)
Neutrophils: 66 %
Platelets: 294 10*3/uL (ref 150–450)
RBC: 3.94 x10E6/uL (ref 3.77–5.28)
RDW: 11.8 % (ref 11.7–15.4)
WBC: 10.7 10*3/uL (ref 3.4–10.8)

## 2023-03-15 LAB — HCG, SERUM, QUALITATIVE: hCG,Beta Subunit,Qual,Serum: NEGATIVE m[IU]/mL (ref <6)

## 2023-03-15 LAB — COMPREHENSIVE METABOLIC PANEL
ALT: 13 [IU]/L (ref 0–32)
AST: 17 [IU]/L (ref 0–40)
Albumin: 4.2 g/dL (ref 3.9–4.9)
Alkaline Phosphatase: 60 [IU]/L (ref 44–121)
BUN/Creatinine Ratio: 15 (ref 9–23)
BUN: 13 mg/dL (ref 6–20)
Bilirubin Total: <0.2 mg/dL (ref 0.0–1.2)
CO2: 25 mmol/L (ref 20–29)
Calcium: 8.9 mg/dL (ref 8.7–10.2)
Chloride: 99 mmol/L (ref 96–106)
Creatinine, Ser: 0.85 mg/dL (ref 0.57–1.00)
Globulin, Total: 2.7 g/dL (ref 1.5–4.5)
Glucose: 95 mg/dL (ref 70–99)
Potassium: 3.1 mmol/L — ABNORMAL LOW (ref 3.5–5.2)
Sodium: 138 mmol/L (ref 134–144)
Total Protein: 6.9 g/dL (ref 6.0–8.5)
eGFR: 90 mL/min/{1.73_m2} (ref >59)

## 2023-03-15 LAB — THYROID PANEL WITH TSH
Free Thyroxine Index: 1.6 (ref 1.2–4.9)
T3 Uptake Ratio: 21 % — ABNORMAL LOW (ref 24–39)
T4, Total: 7.4 ug/dL (ref 4.5–12.0)
TSH: 2.45 u[IU]/mL (ref 0.450–4.500)

## 2023-03-15 LAB — HEMOGLOBIN A1C
Est. average glucose Bld gHb Est-mCnc: 117 mg/dL
Hgb A1c MFr Bld: 5.7 % — ABNORMAL HIGH (ref 4.8–5.6)

## 2023-03-15 LAB — PT AND PTT
INR: 0.9 (ref 0.9–1.2)
Prothrombin Time: 10.7 s (ref 9.1–12.0)
aPTT: 29 s (ref 24–33)

## 2023-03-15 LAB — HIV ANTIBODY (ROUTINE TESTING W REFLEX): HIV Screen 4th Generation wRfx: NONREACTIVE

## 2023-03-15 LAB — PREALBUMIN: PREALBUMIN: 24 mg/dL (ref 14–35)

## 2023-03-16 ENCOUNTER — Other Ambulatory Visit (HOSPITAL_COMMUNITY): Payer: Self-pay

## 2023-03-17 ENCOUNTER — Other Ambulatory Visit: Payer: Self-pay | Admitting: Family Medicine

## 2023-03-17 ENCOUNTER — Other Ambulatory Visit (HOSPITAL_COMMUNITY): Payer: Self-pay

## 2023-03-17 MED ORDER — TWIRLA 120-30 MCG/24HR TD PTWK
1.0000 | MEDICATED_PATCH | TRANSDERMAL | 3 refills | Status: AC
  Filled 2023-03-17: qty 3, 21d supply, fill #0
  Filled 2023-04-26: qty 3, 21d supply, fill #1
  Filled 2023-06-10: qty 3, 21d supply, fill #2

## 2023-03-18 ENCOUNTER — Other Ambulatory Visit (HOSPITAL_COMMUNITY): Payer: Self-pay

## 2023-03-18 MED ORDER — FLUOXETINE HCL 10 MG PO CAPS
10.0000 mg | ORAL_CAPSULE | Freq: Every day | ORAL | 0 refills | Status: DC
  Filled 2023-03-18: qty 60, 60d supply, fill #0

## 2023-03-21 ENCOUNTER — Encounter (HOSPITAL_COMMUNITY): Payer: Self-pay | Admitting: Clinical

## 2023-03-21 ENCOUNTER — Ambulatory Visit (INDEPENDENT_AMBULATORY_CARE_PROVIDER_SITE_OTHER): Payer: Medicaid Other | Admitting: Clinical

## 2023-03-21 DIAGNOSIS — F331 Major depressive disorder, recurrent, moderate: Secondary | ICD-10-CM | POA: Diagnosis not present

## 2023-03-21 DIAGNOSIS — F411 Generalized anxiety disorder: Secondary | ICD-10-CM

## 2023-03-21 DIAGNOSIS — F4310 Post-traumatic stress disorder, unspecified: Secondary | ICD-10-CM

## 2023-03-21 DIAGNOSIS — F410 Panic disorder [episodic paroxysmal anxiety] without agoraphobia: Secondary | ICD-10-CM | POA: Diagnosis not present

## 2023-03-21 NOTE — Progress Notes (Signed)
 THERAPIST PROGRESS NOTE  Session Time:  8:05am-8:29am  Session #16  Virtual Visit via Video Note  I connected with [name] on 03/21/23 at  8:00 AM EST by a video enabled telemedicine application and verified that I am speaking with the correct person using two identifiers.  Location: Patient: car then private office at her workplace Provider: [facility] outpatient therapy office   I discussed the limitations of evaluation and management by telemedicine and the availability of in person appointments. The patient expressed understanding and agreed to proceed.   I discussed the assessment and treatment plan with the patient. The patient was provided an opportunity to ask questions and all were answered. The patient agreed with the plan and demonstrated an understanding of the instructions.   The patient was advised to call back or seek an in-person evaluation if the symptoms worsen or if the condition fails to improve as anticipated.  I provided 24 minutes of non-face-to-face time during this encounter.  Ancel Kass, LCSW    Participation Level: Active  Behavioral Response: Casual and Well Groomed Alert Euthymic   Type of Therapy: Individual Therapy  Treatment Goals addressed:   Goal: LTG: [name] will score less than 5 on the Generalized Anxiety Disorder 7 Scale (GAD-7) Goal: STG: [name] will practice problem solving skills 3 times per week for the next 4 weeks. Goal: Ability to verbalize positive feeling about self will improve Goal: STG-Patient can identify triggers for anxiety (Resolved) Goal: LTG: Identify and decrease cognitive distortions contributing negatively to mood and behavior Goal: LTG: Learn additional communication techniques that can help her navigate her relationships. Goal:  LTG:  Learn the types of boundaries, the necessity, and how to implement boundaries even when it is difficult  Goal: LTG: Reduce frequency, intensity, and  duration of depression symptoms so that daily functioning is improved Goal: STG: Reduce overall depression score by a minimum of 50% on the Patient Health Questionnaire (PHQ-9) Goal: STG: Explore how core beliefs about self impact her negatively and positively, as well as how she can establish and maintain more helpful boundaries in the future for a more balanced result. Goal: LTG: Learn additional communication techniques that can help her navigate her relationships. Goal: LTG: Provide options for patient to work on forgiveness, shame, sleep, relationship to food, or other issues as appropriate and as presents during sessions Goal: STG: [name] will practice emotion regulation skills 3 time(s) per week for the next 26 week(s) Goal: STG: Report a decrease in PTSD symptoms as evidenced by a 50% reduction in overall score on a clinician administered PTSD assessment screen/scale Goal: STG: [name] will experience a reduction in exaggerated beliefs about self and others that interfere with trauma resolution as evidenced by self-report Goal: STG: Practice interpersonal effectiveness skills 3 times per week for the next 26 weeks Goal: STG: [name] will practice conflict resolution skills at least 1 times per week for the next 26 weeks  ProgressTowards Goals: Progressing  Interventions: Supportive   Summary: [name] is a 38 y.o. female who presents with depression and anxiety.  She presented oriented x5 and stated she was feeling "great."  [name] evaluated patient's medication compliance, use of coping tools, and self-care, as applicable.   She was quite excited about her medicine, remembers being scared of it initially, but now feels it is helping her to feel better, cry less, and not be so anxious.  She has not required use of Hydroxyzine .  Her relationship with son's father has  improved with her continued insistence of her boundaries being respected.  She is single and not dating anyone,  states this is the first time she has focused on learning to love herself.  Sometimes she is lonely, but she does not feel bad about that.  She is doing more cleaning and purging, states that she feels better as she gets rid of "the junk."  She is also cooking more instead of buying fast food.  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  She is going to [place] on 2/27 to have a tummy tuck and is excited about this, has always wanted this since having children.  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  Her son has told her that since he has lived with her his whole life, it is time for him to go live with his father.  So he will do that next school year.  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  In 2 years her goal is to move to [place] to make it easier to co-parent her son with his father who currently is very busy with his own life, job, new baby, but still comes to [place] 2-3 times a week to be with son.  She is working on her credit as well and is proud of her accomplishments.    We processed how she has gotten to this positive place.  Much of it has to do with not thinking so negatively, going through her thoughts to examine them for validity and rethink them.  Another contributor is the boundaries she has been able to put in place.  She was encouraged to keep this up even at those times when it is hard.  Suicidal/Homicidal: No  Therapist Response:   Patient is progressing AEB engaging in scheduled therapy session.  Throughout the session, [name] gave patient the opportunity to explore thoughts and feelings associated with current life situations and past/present stressors.   [name] challenged patient gently and appropriately to consider different ways of looking at  reported issues. [name] encouraged patient's expression of feelings and validated these using empathy, active listening, open body language, and unconditional positive regard.   [name] encouraged patient to schedule a doctor's visit for the future, as this is needed to stay on the medicines.  She also is interested in group.   Recommendations:  Return to therapy every 3-4 weeks depending on what she feels is needed, continue to engage in self care behaviors and examine her thoughts for negativity/distortions  Plan: Return to therapy on 4/7, attend group therapy on 2/26, make a doctor's appointment for sometime in the next 2 months before medication runs out  Diagnosis:  Major depressive disorder, recurrent episode, moderate (HCC)  Post traumatic stress disorder  Generalized anxiety disorder with panic attacks  Collaboration of Care: Psychiatrist AEB -  psychiatrist can read therapy notes; therapist can and does read psychiatric notes prior to sessions   Patient/Guardian was advised Release of Information must be obtained prior to any record release in order to collaborate their care with an outside provider. Patient/Guardian was advised if they have not already done so to contact the registration department to sign all necessary forms in order for us  to release information regarding their care.  Consent: Patient/Guardian gives verbal consent for treatment and assignment of benefits for services provided during this visit. Patient/Guardian expressed understanding and agreed to proceed.   Ancel Kass, LCSW 03/21/2023

## 2023-04-04 ENCOUNTER — Ambulatory Visit (HOSPITAL_COMMUNITY): Payer: Medicaid Other | Admitting: Clinical

## 2023-04-26 ENCOUNTER — Other Ambulatory Visit: Payer: Self-pay

## 2023-04-26 ENCOUNTER — Other Ambulatory Visit: Payer: Self-pay | Admitting: Family Medicine

## 2023-04-27 NOTE — Telephone Encounter (Signed)
 Requested medication (s) are due for refill today: Yes  Requested medication (s) are on the active medication list: Yes  Last refill:  03/14/23  Future visit scheduled: No  Notes to clinic:  Left message to call and make appointment.    Requested Prescriptions  Pending Prescriptions Disp Refills   chlorthalidone (HYGROTON) 25 MG tablet 30 tablet 0    Sig: Take 1 tablet (25 mg total) by mouth daily. *pt needs appt*     Cardiovascular: Diuretics - Thiazide Failed - 04/27/2023  1:36 PM      Failed - K in normal range and within 180 days    Potassium  Date Value Ref Range Status  03/14/2023 3.1 (L) 3.5 - 5.2 mmol/L Final         Failed - Valid encounter within last 6 months    Recent Outpatient Visits           6 months ago Primary hypertension   [facility], [facility]   1 year ago Primary hypertension   [facility]   1 year ago Adjustment reaction with anxiety and depression    [facility], [facility]   2 years ago Establishing care with new doctor, encounter for   [facility], MD              Passed - Cr in normal range and within 180 days    Creatinine, Ser  Date Value Ref Range Status  03/14/2023 0.85 0.57 - 1.00 mg/dL Final   Creatinine, Urine  Date Value Ref Range Status  01/01/2015 303.00 mg/dL Final         Passed - Na in normal range and within 180 days    Sodium  Date Value Ref Range Status  03/14/2023 138 134 - 144 mmol/L Final         Passed - Last BP in normal range    BP Readings from Last 1 Encounters:  03/14/23 112/72

## 2023-05-03 ENCOUNTER — Other Ambulatory Visit (HOSPITAL_COMMUNITY): Payer: Self-pay

## 2023-05-03 ENCOUNTER — Ambulatory Visit: Admitting: Family Medicine

## 2023-05-03 VITALS — BP 128/82 | HR 92 | Resp 16 | Ht 64.0 in | Wt 183.0 lb

## 2023-05-03 DIAGNOSIS — I10 Essential (primary) hypertension: Secondary | ICD-10-CM | POA: Diagnosis not present

## 2023-05-03 DIAGNOSIS — K219 Gastro-esophageal reflux disease without esophagitis: Secondary | ICD-10-CM

## 2023-05-03 DIAGNOSIS — E876 Hypokalemia: Secondary | ICD-10-CM | POA: Diagnosis not present

## 2023-05-03 MED ORDER — CHLORTHALIDONE 25 MG PO TABS
25.0000 mg | ORAL_TABLET | Freq: Every day | ORAL | 1 refills | Status: AC
  Filled 2023-05-03: qty 90, 90d supply, fill #0
  Filled 2023-06-10 – 2023-09-22 (×2): qty 90, 90d supply, fill #1

## 2023-05-03 MED ORDER — CHLORTHALIDONE 25 MG PO TABS
25.0000 mg | ORAL_TABLET | Freq: Every day | ORAL | 0 refills | Status: DC
  Filled 2023-05-03: qty 30, 30d supply, fill #0

## 2023-05-03 MED ORDER — NEOMYCIN-POLYMYXIN-DEXAMETH 3.5-10000-0.1 OP SUSP
1.0000 [drp] | Freq: Three times a day (TID) | OPHTHALMIC | 0 refills | Status: AC
  Filled 2023-05-03: qty 5, 17d supply, fill #0

## 2023-05-03 MED ORDER — PANTOPRAZOLE SODIUM 40 MG PO TBEC
40.0000 mg | DELAYED_RELEASE_TABLET | Freq: Every day | ORAL | 2 refills | Status: DC
  Filled 2023-05-03: qty 30, 30d supply, fill #0

## 2023-05-03 MED ORDER — PANTOPRAZOLE SODIUM 40 MG PO TBEC
40.0000 mg | DELAYED_RELEASE_TABLET | Freq: Every day | ORAL | 1 refills | Status: AC
  Filled 2023-05-03 – 2023-06-10 (×2): qty 90, 90d supply, fill #0
  Filled 2023-09-22: qty 90, 90d supply, fill #1

## 2023-05-03 NOTE — Progress Notes (Signed)
 Date:  05/03/2023   Name:  [name]   DOB:  Mar 07, 1985   MRN:  [id number]   Chief Complaint: Hypertension and Gastroesophageal Reflux  Hypertension This is a chronic problem. The current episode started more than 1 year ago. The problem has been gradually improving since onset. The problem is controlled. Pertinent negatives include no blurred vision, chest pain, headaches, neck pain, orthopnea, palpitations, peripheral edema, PND, shortness of breath or sweats. There are no associated agents to hypertension. Risk factors for coronary artery disease include post-menopausal state. Past treatments include diuretics. The current treatment provides moderate improvement. There are no compliance problems.  There is no history of CAD/MI or CVA. There is no history of chronic renal disease, a hypertension causing med or renovascular disease.  Gastroesophageal Reflux She reports no abdominal pain, no belching, no chest pain, no choking, no coughing, no dysphagia, no heartburn, no nausea or no wheezing. This is a chronic problem. The current episode started more than 1 year ago. The problem occurs rarely (on medication). The problem has been gradually improving. The symptoms are aggravated by certain foods. Pertinent negatives include no melena. She has tried a PPI for the symptoms. The treatment provided moderate relief.    Lab Results  Component Value Date   NA 138 03/14/2023   K 3.1 (L) 03/14/2023   CO2 25 03/14/2023   GLUCOSE 95 03/14/2023   BUN 13 03/14/2023   CREATININE 0.85 03/14/2023   CALCIUM 8.9 03/14/2023   EGFR 90 03/14/2023   GFRNONAA >60 10/17/2022   No results found for: "CHOL", "HDL", "LDLCALC", "LDLDIRECT", "TRIG", "CHOLHDL" Lab Results  Component Value Date   TSH 2.450 03/14/2023   Lab Results  Component Value Date   HGBA1C 5.7 (H) 03/14/2023   Lab Results  Component Value Date   WBC 10.7 03/14/2023   HGB 12.1 03/14/2023   HCT 35.6 03/14/2023   MCV 90  03/14/2023   PLT 294 03/14/2023   Lab Results  Component Value Date   ALT 13 03/14/2023   AST 17 03/14/2023   ALKPHOS 60 03/14/2023   BILITOT <0.2 03/14/2023   No results found for: "25OHVITD2", "25OHVITD3", "VD25OH"   Review of Systems  Constitutional:  Negative for unexpected weight change.  Eyes:  Negative for blurred vision and visual disturbance.  Respiratory:  Negative for cough, choking, chest tightness, shortness of breath and wheezing.   Cardiovascular:  Negative for chest pain, palpitations, orthopnea, leg swelling and PND.  Gastrointestinal:  Negative for abdominal distention, abdominal pain, blood in stool, dysphagia, heartburn, melena and nausea.  Endocrine: Negative for polydipsia and polyuria.  Genitourinary:  Negative for difficulty urinating and hematuria.  Musculoskeletal:  Negative for neck pain.  Neurological:  Negative for headaches.    Patient Active Problem List   Diagnosis Date Noted   Mixed anxiety and depressive disorder 03/14/2023   Asthma 03/14/2023   Suicide and self-inflicted injury (HCC) 03/14/2023   Dysphagia 01/18/2023   Belching 01/18/2023   Constipation 01/18/2023   IIH (idiopathic intracranial hypertension) 06/23/2022   Avitaminosis D 11/20/2021   Normal labor and delivery 01/10/2015   SVD (spontaneous vaginal delivery) 01/10/2015   Normal pregnancy in multigravida 01/10/2015   Normal pregnancy in multigravida in third trimester 01/09/2015    Allergies  Allergen Reactions   Amoxicillin Itching   Tape Rash    Past Surgical History:  Procedure Laterality Date   DILATION AND CURETTAGE OF UTERUS     TONSILLECTOMY     WISDOM  TOOTH EXTRACTION      Social History   Tobacco Use   Smoking status: Never   Smokeless tobacco: Never  Vaping Use   Vaping status: Never Used  Substance Use Topics   Alcohol use: Yes    Comment: occ   Drug use: No     Medication list has been reviewed and updated.  No outpatient medications have  been marked as taking for the 05/03/23 encounter (Office Visit) with Duanne Limerick, MD.       10/14/2022    3:09 PM 04/14/2022    4:26 PM 12/04/2021    9:33 AM 05/11/2021    2:00 PM  GAD 7 : Generalized Anxiety Score  Nervous, Anxious, on Edge 0  0 0  Control/stop worrying 0  0 2  Worry too much - different things 0  0 2  Trouble relaxing 0  0 2  Restless 0  0 0  Easily annoyed or irritable 0  0 2  Afraid - awful might happen 0  0 1  Total GAD 7 Score 0  0 9  Anxiety Difficulty Not difficult at all  Not difficult at all Somewhat difficult     Information is confidential and restricted. Go to Review Flowsheets to unlock data.       12/06/2022   12:04 PM 10/14/2022    3:09 PM 04/14/2022    4:23 PM  Depression screen PHQ 2/9  Decreased Interest  0   Down, Depressed, Hopeless  0   PHQ - 2 Score  0   Altered sleeping  0   Tired, decreased energy  0   Change in appetite  0   Feeling bad or failure about yourself   0   Trouble concentrating  0   Moving slowly or fidgety/restless  0   Suicidal thoughts  0   PHQ-9 Score  0   Difficult doing work/chores  Not difficult at all      Information is confidential and restricted. Go to Review Flowsheets to unlock data.    BP Readings from Last 3 Encounters:  05/03/23 128/82  03/14/23 112/72  01/21/23 136/81    Physical Exam Vitals and nursing note reviewed.  Constitutional:      General: She is not in acute distress.    Appearance: She is not diaphoretic.  HENT:     Head: Normocephalic and atraumatic.     Right Ear: Tympanic membrane, ear canal and external ear normal.     Left Ear: Tympanic membrane, ear canal and external ear normal.     Nose: Nose normal. No congestion or rhinorrhea.     Mouth/Throat:     Mouth: Mucous membranes are moist.  Eyes:     General:        Right eye: No discharge.        Left eye: No discharge.     Conjunctiva/sclera: Conjunctivae normal.     Pupils: Pupils are equal, round, and reactive to  light.  Neck:     Thyroid: No thyromegaly.     Vascular: No JVD.  Cardiovascular:     Rate and Rhythm: Normal rate and regular rhythm.     Heart sounds: Normal heart sounds. No murmur heard.    No friction rub. No gallop.  Pulmonary:     Effort: Pulmonary effort is normal.     Breath sounds: Normal breath sounds. No wheezing, rhonchi or rales.  Abdominal:     General: Bowel sounds are normal.  Palpations: Abdomen is soft. There is no mass.     Tenderness: There is no abdominal tenderness. There is no guarding.  Musculoskeletal:        General: Normal range of motion.     Cervical back: Normal range of motion and neck supple.  Lymphadenopathy:     Cervical: No cervical adenopathy.  Skin:    General: Skin is warm and dry.  Neurological:     Mental Status: She is alert.     Wt Readings from Last 3 Encounters:  05/03/23 183 lb (83 kg)  03/14/23 183 lb (83 kg)  01/21/23 181 lb (82.1 kg)    BP 128/82   Pulse 92   Resp 16   Ht 5\' 4"  (1.626 m)   Wt 183 lb (83 kg)   SpO2 98%   BMI 31.41 kg/m   Assessment and Plan:  1. Primary hypertension (Primary) Chronic.  Controlled.  Stable.  Asymptomatic.  Tolerating medication well.  Blood pressure 128/82.  Continue current dosing of chlorthalidone 25 mg once a day.  Refer review of previous labs as unremarkable except a low potassium and we will check a spot potassium to see if this needs to be supplemented. - chlorthalidone (HYGROTON) 25 MG tablet; Take 1 tablet (25 mg total) by mouth daily. *pt needs appt*  Dispense:90 tablet; Refill: 1  2. Hypokalemia New onset.  Likely iatrogenic on diuretic.  Patient is currently trying to use dietary approach however I rather doubt this is can be sufficient and we will check her serum potassium to see if supplemental KCl is necessary. - Potassium  3. Gastroesophageal reflux disease, unspecified whether esophagitis present Chronic.  Controlled.  Stable.  Patient has symptomatic relief on  pantoprazole 40 mg as needed.  Will recheck in 6 months - pantoprazole (PROTONIX) 40 MG tablet; Take 1 tablet (40 mg total) by mouth daily.  Dispense: 30 tablet; Refill: 2    Elizabeth Sauer, MD

## 2023-05-04 ENCOUNTER — Ambulatory Visit (HOSPITAL_BASED_OUTPATIENT_CLINIC_OR_DEPARTMENT_OTHER): Admitting: Family

## 2023-05-04 ENCOUNTER — Other Ambulatory Visit: Payer: Self-pay

## 2023-05-04 ENCOUNTER — Encounter: Payer: Self-pay | Admitting: Family Medicine

## 2023-05-04 ENCOUNTER — Other Ambulatory Visit (HOSPITAL_COMMUNITY): Payer: Self-pay

## 2023-05-04 ENCOUNTER — Encounter (HOSPITAL_COMMUNITY): Payer: Self-pay | Admitting: Family

## 2023-05-04 VITALS — BP 137/86 | HR 79 | Ht 64.0 in | Wt 181.0 lb

## 2023-05-04 DIAGNOSIS — F331 Major depressive disorder, recurrent, moderate: Secondary | ICD-10-CM

## 2023-05-04 LAB — POTASSIUM: Potassium: 3.7 mmol/L (ref 3.5–5.2)

## 2023-05-04 MED ORDER — FLUOXETINE HCL 10 MG PO CAPS
10.0000 mg | ORAL_CAPSULE | Freq: Every day | ORAL | 1 refills | Status: AC
  Filled 2023-05-04 – 2023-06-10 (×2): qty 90, 90d supply, fill #0
  Filled 2023-09-22: qty 90, 90d supply, fill #1

## 2023-05-04 NOTE — Progress Notes (Addendum)
 [facility] MD/PA/NP OP Progress Note  05/04/2023 9:59 AM [name]  MRN:  [id number]  Chief Complaint: Medication management  HPI: [name] 38 year old female presents for medication management follow-up appointment.  Patient was seen and evaluated face to face.  She carries a diagnosis related to depression and anxiety.  She was prescribed Prozac 10 mg which she reports she has been taking and tolerating well.  States that she has noticed a difference with the medication has not had " many crying spells."  Reports her mood is leveled out.  No concerns related to suicidal or homicidal ideations.  Denies auditory or visual hallucinations.  She reports a good appetite.  States she is resting well throughout the night.  Reports she has not had to utilize hydroxyzine for anxiety. Discussed titrating Prozac to 20 mg however she declines stating she feels like her mood has improved on 10 mg.  Education was provided with running out of medications are low on meds she may contact local pharmacy and/or office for medication refills.  She was receptive to plan.  Will continue to monitor symptoms.  Support, encouragement and reassurance was provided.  During evaluation Stephan D Slavens is sitting, she is alert/oriented x 4; calm/cooperative; and mood congruent with affect.  Patient is speaking in a clear tone at moderate volume, and normal pace; with good eye contact.  Her thought process is coherent and relevant; There is no indication that she is currently responding to internal/external stimuli or experiencing delusional thought content.  Patient denies suicidal/self-harm/homicidal ideation, psychosis, and paranoia.  Patient has remained calm throughout assessment and has answered questions appropriately.  Visit Diagnosis:    ICD-10-CM   1. Major depressive disorder, recurrent episode, moderate (HCC)  F33.1       Past Psychiatric History: See chart  Past Medical History:  Past Medical  History:  Diagnosis Date   Anemia    Anxiety    Asthma    Chlamydia    Depression    suicide att in 2007   Hx of varicella    Normal pregnancy in multigravida in third trimester 01/09/2015   Pseudotumor cerebri    PTSD (post-traumatic stress disorder)     Past Surgical History:  Procedure Laterality Date   DILATION AND CURETTAGE OF UTERUS     TONSILLECTOMY     WISDOM TOOTH EXTRACTION      Family Psychiatric History:   Family History:  Family History  Problem Relation Age of Onset   Heart disease Mother    Hypertension Mother    Cancer Father    Depression Maternal Aunt    Hypertension Maternal Aunt    Stroke Maternal Aunt    Hypertension Maternal Grandmother    Alcohol abuse Neg Hx    Arthritis Neg Hx    Asthma Neg Hx    Birth defects Neg Hx    COPD Neg Hx    Diabetes Neg Hx    Drug abuse Neg Hx    Early death Neg Hx    Hearing loss Neg Hx    Hyperlipidemia Neg Hx    Kidney disease Neg Hx    Learning disabilities Neg Hx    Mental illness Neg Hx    Mental retardation Neg Hx    Miscarriages / Stillbirths Neg Hx    Vision loss Neg Hx    Varicose Veins Neg Hx     Social History:  Social History   Socioeconomic History   Marital status: Single  Spouse name: Not on file   Number of children: Not on file   Years of education: Not on file   Highest education level: Not on file  Occupational History   Not on file  Tobacco Use   Smoking status: Never   Smokeless tobacco: Never  Vaping Use   Vaping status: Never Used  Substance and Sexual Activity   Alcohol use: Yes    Comment: occ   Drug use: No   Sexual activity: Yes  Other Topics Concern   Not on file  Social History Narrative   Not on file   Social Drivers of Health   Financial Resource Strain: Not on file  Food Insecurity: Not on file  Transportation Needs: Not on file  Physical Activity: Not on file  Stress: Not on file  Social Connections: Not on file    Allergies:  Allergies   Allergen Reactions   Amoxicillin Itching   Tape Rash    Metabolic Disorder Labs: Lab Results  Component Value Date   HGBA1C 5.7 (H) 03/14/2023   No results found for: "PROLACTIN" No results found for: "CHOL", "TRIG", "HDL", "CHOLHDL", "VLDL", "LDLCALC" Lab Results  Component Value Date   TSH 2.450 03/14/2023   TSH 1.68 01/19/2023    Therapeutic Level Labs: No results found for: "LITHIUM" No results found for: "VALPROATE" No results found for: "CBMZ"  Current Medications: Current Outpatient Medications  Medication Sig Dispense Refill   chlorthalidone (HYGROTON) 25 MG tablet Take 1 tablet (25 mg total) by mouth daily. *pt needs appt* 90 tablet 1   FLUoxetine (PROZAC) 10 MG capsule Take 1 capsule (10 mg total) by mouth daily. 90 capsule 1   hydrOXYzine (ATARAX) 10 MG tablet Take 1 tablet (10 mg total) by mouth 3 (three) times daily as needed. 30 tablet 0   neomycin-polymyxin b-dexamethasone (MAXITROL) 3.5-10000-0.1 SUSP Apply 1 drop into affected eye three times a day Shake bottle before each use. 5 mL 0   pantoprazole (PROTONIX) 40 MG tablet Take 1 tablet (40 mg total) by mouth daily. 90 tablet 1   TWIRLA 120-30 MCG/24HR PTWK Place 1 each onto the skin once a week. 3 patch 3   No current facility-administered medications for this visit.     Musculoskeletal: Strength & Muscle Tone: within normal limits Gait & Station: normal Patient leans: N/A  Psychiatric Specialty Exam: Review of Systems  Psychiatric/Behavioral:  Negative for decreased concentration. The patient is nervous/anxious.   All other systems reviewed and are negative.   Blood pressure 137/86, pulse 79, height 5\' 4"  (1.626 m), weight 181 lb (82.1 kg).Body mass index is 31.07 kg/m.  General Appearance: Casual  Eye Contact:  Good  Speech:  Clear and Coherent  Volume:  Normal  Mood:  Euthymic  Affect:  Congruent  Thought Process:  Coherent  Orientation:  Full (Time, Place, and Person)  Thought Content:  Logical   Suicidal Thoughts:  No  Homicidal Thoughts:  No  Memory:  Immediate;   Good Recent;   Good  Judgement:  Good  Insight:  Good  Psychomotor Activity:  Normal  Concentration:  Concentration: Good  Recall:  Good  Fund of Knowledge: Good  Language: Good  Akathisia:  No  Handed:  Right  AIMS (if indicated): not done  Assets:  Communication Skills Desire for Improvement  ADL's:  Intact  Cognition: WNL  Sleep:  Good   Screenings: GAD-7    Flowsheet Row Office Visit from 05/03/2023 in [facility] Primary Care & Sports  Medicine at [facility] Office Visit from 10/14/2022 in [facility] Primary Care & Sports Medicine at [facility] Counselor from 04/14/2022 in [facility] Outpatient Behavioral Health at [facility] Visit from 12/04/2021 in [facility] Primary Care & Sports Medicine at [facility] Office Visit from 05/11/2021 in [facility] Primary Care & Sports Medicine at [facility]  Total GAD-7 Score 0 0 20 0 9      PHQ2-9    Flowsheet Row Office Visit from 05/03/2023 in [facility] Primary Care & Sports Medicine at [facility] Office Visit from 12/06/2022 in [facility] Office Visit from 10/14/2022 in [facility] Primary Care & Sports Medicine at [facility] Counselor from 04/14/2022 in [facility] at [facility] 2 Visit from 12/04/2021 in [facility] Primary Care & Sports Medicine at [facility]  PHQ-2 Total Score 0 3 0 1 0  PHQ-9 Total Score -- 11 0 6 0      Flowsheet Row Office Visit from 12[facility]06/2022 in [facility] ED from 10/17/2022 in [facility] Emergency Department at [facility] Counselor from 04/14/2022 in [facility] Out[facility]al Health at [facility] RISK CATEGORY Error: Q3, 4, or 5 should not be populated when Q2 is No No Risk Error: Q6 is Yes, you must answer 7        Assessment and Plan: [name] 38 year old African-American female presents for medication management follow-up appointment.  Currently prescribed Prozac 10 mg daily which she reports she has been taking and tolerating well.  Denies that she has had to utilize hydroxyzine for anxiety symptoms.  States overall her mood has stabilized since being on medications.  Reports decreased symptoms related to depression crying spells and anxiety.   Collaboration of Care: Collaboration of Care: Medication Management AEB Prozac 10 mg daily  Patient/Guardian was advised Release of Information must be obtained prior to any record release in order to collaborate their care with an outside provider. Patient/Guardian was advised if they have not already done so to contact the registration department to sign all necessary forms in order for [place] to release information regarding their care.   Consent: Patient/Guardian gives verbal consent for treatment and assignment of benefits for services provided during this visit. Patient/Guardian expressed understanding and agreed to proceed.    Oneta Rack, NP 05/04/2023, 9:59 AM

## 2023-05-16 ENCOUNTER — Other Ambulatory Visit (HOSPITAL_COMMUNITY): Payer: Self-pay

## 2023-05-16 ENCOUNTER — Encounter (HOSPITAL_COMMUNITY): Payer: Self-pay

## 2023-05-16 ENCOUNTER — Ambulatory Visit (HOSPITAL_COMMUNITY): Payer: Medicaid Other | Admitting: Clinical

## 2023-05-30 ENCOUNTER — Ambulatory Visit (INDEPENDENT_AMBULATORY_CARE_PROVIDER_SITE_OTHER): Payer: Medicaid Other | Admitting: Clinical

## 2023-05-30 ENCOUNTER — Encounter (HOSPITAL_COMMUNITY): Payer: Self-pay | Admitting: Clinical

## 2023-05-30 DIAGNOSIS — F411 Generalized anxiety disorder: Secondary | ICD-10-CM | POA: Diagnosis not present

## 2023-05-30 DIAGNOSIS — F331 Major depressive disorder, recurrent, moderate: Secondary | ICD-10-CM

## 2023-05-30 DIAGNOSIS — F410 Panic disorder [episodic paroxysmal anxiety] without agoraphobia: Secondary | ICD-10-CM | POA: Diagnosis not present

## 2023-05-30 DIAGNOSIS — F4310 Post-traumatic stress disorder, unspecified: Secondary | ICD-10-CM
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
## 2023-06-10 ENCOUNTER — Other Ambulatory Visit: Payer: Self-pay

## 2023-06-10 ENCOUNTER — Other Ambulatory Visit (HOSPITAL_COMMUNITY): Payer: Self-pay

## 2023-06-23 ENCOUNTER — Other Ambulatory Visit (HOSPITAL_COMMUNITY): Payer: Self-pay

## 2023-06-23 ENCOUNTER — Telehealth: Payer: Self-pay | Admitting: *Deleted

## 2023-06-23 MED ORDER — HYDROCORTISONE ACETATE 25 MG RE SUPP
25.0000 mg | Freq: Every evening | RECTAL | 1 refills | Status: AC
  Filled 2023-06-23: qty 7, 7d supply, fill #0

## 2023-06-23 NOTE — Telephone Encounter (Signed)
 Patient spoke with Camilo Cella, [place] and states she is having rectal discomfort with bowel movements. Per Camilo Cella, [place] send patient in Hydrocortisone suppositories nightly for 7 days, refill 1 to pharmacy. Patient informed.

## 2023-06-29 ENCOUNTER — Ambulatory Visit (HOSPITAL_COMMUNITY): Admitting: Clinical

## 2023-06-29 ENCOUNTER — Telehealth (HOSPITAL_BASED_OUTPATIENT_CLINIC_OR_DEPARTMENT_OTHER): Admitting: Family

## 2023-06-29 DIAGNOSIS — R41840 Attention and concentration deficit: Secondary | ICD-10-CM

## 2023-06-29 NOTE — Progress Notes (Signed)
 Virtual Visit via Video Note  I connected with [name] on 06/29/23 at  8:00 AM EDT by a video enabled telemedicine application and verified that I am speaking with the correct person using two identifiers.  Location: Patient: work Provider: office   I discussed the limitations of evaluation and management by telemedicine and the availability of in person appointments. The patient expressed understanding and agreed to proceed.   I discussed the assessment and treatment plan with the patient. The patient was provided an opportunity to ask questions and all were answered. The patient agreed with the plan and demonstrated an understanding of the instructions.   The patient was advised to call back or seek an in-person evaluation if the symptoms worsen or if the condition fails to improve as anticipated.  I provided 10 minutes of non-face-to-face time during this encounter.   Levester Reagin, NP   [facility] MD/PA/NP OP Progress Note  06/29/2023 8:12 AM OLIVINE HIERS  MRN:  [id number]  Chief Complaint: " I am going back to school and I can't focus."   HPI: [name] 38 year old female reports requested a follow-up appointment due to symptoms related to poor concentration and focus.  She reports she is going back to school and has noticed that she is unable to stay focused on 1 task.  Stated symptoms are affecting her home life.  Reports starting and starting multiple projects, when she is distracted easily.  Reports possibility of family history related to attention and focus.  Reports she was experiencing symptoms while in high school as well.  However was never tested.  Reports she often procrastinates. "  I wait to the last minute to get it done."  She denied having any IEP's during school.  Patient was offered nonstimulant medications to include Strattera and/or Intuniv however she declined at this time.   Chart reviewed she is currently prescribed Prozac  10 mg daily she  denied needing medication refills at this time.  No concerns related to suicidal or homicidal ideations.  Patient was provided with outpatient information to follow-up with [place] [facility] specialist and/or [facility] for adult ADHD testing.  She was receptive to plan.  Support, encouragement and reassurance was provided.    Visit Diagnosis:    ICD-10-CM   1. Poor concentration  R41.840       Past Psychiatric History:   Past Medical History:  Past Medical History:  Diagnosis Date   Anemia    Anxiety    Asthma    Chlamydia    Depression    suicide att in 2007   Hx of varicella    Normal pregnancy in multigravida in third trimester 01/09/2015   Pseudotumor cerebri    PTSD (post-traumatic stress disorder)     Past Surgical History:  Procedure Laterality Date   DILATION AND CURETTAGE OF UTERUS     TONSILLECTOMY     WISDOM TOOTH EXTRACTION      Family Psychiatric History:   Family History:  Family History  Problem Relation Age of Onset   Heart disease Mother    Hypertension Mother    Cancer Father    Depression Maternal Aunt    Hypertension Maternal Aunt    Stroke Maternal Aunt    Hypertension Maternal Grandmother    Alcohol abuse Neg Hx    Arthritis Neg Hx    Asthma Neg Hx    Birth defects Neg Hx    COPD Neg Hx    Diabetes Neg Hx  Drug abuse Neg Hx    Early death Neg Hx    Hearing loss Neg Hx    Hyperlipidemia Neg Hx    Kidney disease Neg Hx    Learning disabilities Neg Hx    Mental illness Neg Hx    Mental retardation Neg Hx    Miscarriages / Stillbirths Neg Hx    Vision loss Neg Hx    Varicose Veins Neg Hx     Social History:  Social History   Socioeconomic History   Marital status: Single    Spouse name: Not on file   Number of children: Not on file   Years of education: Not on file   Highest education level: Not on file  Occupational History   Not on file  Tobacco Use   Smoking status: Never   Smokeless tobacco: Never  Vaping Use    Vaping status: Never Used  Substance and Sexual Activity   Alcohol use: Yes    Comment: occ   Drug use: No   Sexual activity: Yes  Other Topics Concern   Not on file  Social History Narrative   Not on file   Social Drivers of Health   Financial Resource Strain: Not on file  Food Insecurity: Not on file  Transportation Needs: Not on file  Physical Activity: Not on file  Stress: Not on file  Social Connections: Not on file    Allergies:  Allergies  Allergen Reactions   Amoxicillin Itching   Tape Rash    Metabolic Disorder Labs: Lab Results  Component Value Date   HGBA1C 5.7 (H) 03/14/2023   No results found for: "PROLACTIN" No results found for: "CHOL", "TRIG", "HDL", "CHOLHDL", "VLDL", "LDLCALC" Lab Results  Component Value Date   TSH 2.450 03/14/2023   TSH 1.68 01/19/2023    Therapeutic Level Labs: No results found for: "LITHIUM" No results found for: "VALPROATE" No results found for: "CBMZ"  Current Medications: Current Outpatient Medications  Medication Sig Dispense Refill   chlorthalidone  (HYGROTON ) 25 MG tablet Take 1 tablet (25 mg total) by mouth daily. *pt needs appt* 90 tablet 1   FLUoxetine  (PROZAC ) 10 MG capsule Take 1 capsule (10 mg total) by mouth daily. 90 capsule 1   hydrocortisone  (ANUSOL -HC) 25 MG suppository Place 1 suppository (25 mg total) rectally at bedtime. 7 suppository 1   hydrOXYzine  (ATARAX ) 10 MG tablet Take 1 tablet (10 mg total) by mouth 3 (three) times daily as needed. 30 tablet 0   neomycin -polymyxin b -dexamethasone  (MAXITROL ) 3.5-10000-0.1 SUSP Apply 1 drop into affected eye three times a day Shake bottle before each use. 5 mL 0   pantoprazole  (PROTONIX ) 40 MG tablet Take 1 tablet (40 mg total) by mouth daily. 90 tablet 1   TWIRLA  120-30 MCG/24HR PTWK Place 1 each onto the skin once a week. 3 patch 3   No current facility-administered medications for this visit.     Musculoskeletal: Virtual visit  Psychiatric Specialty  Exam: Review of Systems  There were no vitals taken for this visit.There is no height or weight on file to calculate BMI.  General Appearance: Casual  Eye Contact:  Good  Speech:  Clear and Coherent  Volume:  Normal  Mood:  Euphoric  Affect:  Congruent  Thought Process:  Coherent  Orientation:  Full (Time, Place, and Person)  Thought Content: Logical   Suicidal Thoughts:  No  Homicidal Thoughts:  No  Memory:  Immediate;   Good Recent;   Good  Judgement:  Good  Insight:  Good  Psychomotor Activity:  Normal  Concentration:  Concentration: Fair  Recall:  Good  Fund of Knowledge: Good  Language: Good  Akathisia:  No  Handed:  Right  AIMS (if indicated): not done  Assets:  Communication Skills Desire for Improvement Social Support Vocational/Educational  ADL's:  Intact  Cognition: WNL  Sleep:  Good   Screenings: GAD-7    Advertising copywriter from 05/30/2023 in [facility] at [facility] Visit from 05/03/2023 in [facility] Primary Care & Sports Medicine at [facility] Office Visit from 10/14/2022 in [facility] Primary Care & Sports Medicine at [facility] Counselor from 04/14/2022 in [facility] at [facility] Visit from 12/04/2021 in [facility] Primary [facility] at [facility]  Total GAD-7 Score 14 0 0 20 0      PHQ2-9    Flowsheet Row Counselor from 05/30/2023 in [facility] at [facility] Visit from 05/03/2023 in [facility] Primary Care & Sports Medicine at [facility] Office Visit from 12/06/2022 [facility] Visit from 10/14/2022 in [facility] Primary Care & Sports Medicine at [facility] Counselor from 04/14/2022 in [facility] Behavioral [facility] at [facility] Total Score 3 0 3 0 1  PHQ-9 Total Score 14 -- 11 0 6      Flowsheet Row Office Visit from 12/06/2022 in  [facility] ED from 10/17/2022 in [facility] at [facility] Counselor from 04/14/2022 in [facility] Behavioral Health at [facility] RISK CATEGORY Error: Q3, 4, or 5 should not be populated when Q2 is No No Risk Error: Q6 is Yes, you must answer 7        Assessment and Plan: Sybilla Malhotra 38 year old African-American female presents concerns related to inability to concentration.  She reports she initially followed up with her primary care provider patient's.  States she was referred to psychiatry.  Patient was provided with outpatient resources for adult ADHD testing.  No concerns related to suicidal or homicidal ideation.  Denied that she needed medication refills at this visit.  Keep follow-up appointment 2 months.  Support, encouragement  asnd reassurance was provided.  Collaboration of Care: Collaboration of Care: Medication Management AEB continue Prozac  and hydroxyzine  as directed  Patient/Guardian was advised Release of Information must be obtained prior to any record release in order to collaborate their care with an outside provider. Patient/Guardian was advised if they have not already done so to contact the registration department to sign all necessary forms in order for us  to release information regarding their care.   Consent: Patient/Guardian gives verbal consent for treatment and assignment of benefits for services provided during this visit. Patient/Guardian expressed understanding and agreed to proceed.    Levester Reagin, NP 06/29/2023, 8:12 AM

## 2023-07-11 ENCOUNTER — Other Ambulatory Visit (HOSPITAL_COMMUNITY): Payer: Self-pay

## 2023-07-11 ENCOUNTER — Telehealth: Payer: Self-pay | Admitting: *Deleted

## 2023-07-11 ENCOUNTER — Other Ambulatory Visit (HOSPITAL_BASED_OUTPATIENT_CLINIC_OR_DEPARTMENT_OTHER): Payer: Self-pay

## 2023-07-11 MED ORDER — PREDNISONE 10 MG (21) PO TBPK
ORAL_TABLET | ORAL | 0 refills | Status: AC
  Filled 2023-07-11: qty 21, 6d supply, fill #0

## 2023-07-11 NOTE — Telephone Encounter (Signed)
 Per Camilo Cella, [place] spoke with patient and she is having a poison ivy outbreak and is currently waiting to see a new primary care provider.   Per Camilo Cella, [place] please send in patient Prednisone dose pack. Script sent to pharmacy.

## 2023-07-12 ENCOUNTER — Encounter (HOSPITAL_COMMUNITY): Payer: Self-pay | Admitting: Clinical

## 2023-07-12 ENCOUNTER — Ambulatory Visit (INDEPENDENT_AMBULATORY_CARE_PROVIDER_SITE_OTHER): Admitting: Clinical

## 2023-07-12 DIAGNOSIS — F411 Generalized anxiety disorder: Secondary | ICD-10-CM | POA: Diagnosis not present

## 2023-07-12 DIAGNOSIS — F410 Panic disorder [episodic paroxysmal anxiety] without agoraphobia: Secondary | ICD-10-CM

## 2023-07-12 DIAGNOSIS — F331 Major depressive disorder, recurrent, moderate: Secondary | ICD-10-CM

## 2023-07-12 DIAGNOSIS — R41840 Attention and concentration deficit: Secondary | ICD-10-CM
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
## 2023-07-22 ENCOUNTER — Other Ambulatory Visit (HOSPITAL_COMMUNITY): Payer: Self-pay

## 2023-07-22 MED ORDER — NORELGESTROMIN-ETH ESTRADIOL 150-35 MCG/24HR TD PTWK
1.0000 | MEDICATED_PATCH | TRANSDERMAL | 4 refills | Status: AC
  Filled 2023-07-22: qty 9, 63d supply, fill #0
  Filled 2023-09-22: qty 9, 63d supply, fill #1
  Filled 2023-11-21: qty 9, 63d supply, fill #2
  Filled 2024-02-14: qty 9, 63d supply, fill #3

## 2023-07-26 ENCOUNTER — Encounter (HOSPITAL_COMMUNITY): Payer: Self-pay | Admitting: Clinical

## 2023-07-26 ENCOUNTER — Ambulatory Visit (INDEPENDENT_AMBULATORY_CARE_PROVIDER_SITE_OTHER): Admitting: Clinical

## 2023-07-26 DIAGNOSIS — R41840 Attention and concentration deficit: Secondary | ICD-10-CM

## 2023-07-26 DIAGNOSIS — F411 Generalized anxiety disorder: Secondary | ICD-10-CM | POA: Diagnosis not present

## 2023-07-26 DIAGNOSIS — F4310 Post-traumatic stress disorder, unspecified: Secondary | ICD-10-CM | POA: Diagnosis not present

## 2023-07-26 DIAGNOSIS — F410 Panic disorder [episodic paroxysmal anxiety] without agoraphobia: Secondary | ICD-10-CM

## 2023-07-26 DIAGNOSIS — F331 Major depressive disorder, recurrent, moderate: Secondary | ICD-10-CM
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
## 2023-08-03 ENCOUNTER — Telehealth: Payer: Self-pay | Admitting: *Deleted

## 2023-08-03 ENCOUNTER — Other Ambulatory Visit (HOSPITAL_COMMUNITY): Payer: Self-pay

## 2023-08-03 MED ORDER — ONDANSETRON 4 MG PO TBDP
4.0000 mg | ORAL_TABLET | Freq: Three times a day (TID) | ORAL | 0 refills | Status: AC | PRN
  Filled 2023-08-03: qty 20, 7d supply, fill #0

## 2023-08-03 NOTE — Telephone Encounter (Signed)
 Script sent to pharmacy.

## 2023-08-03 NOTE — Telephone Encounter (Signed)
 Patient would like a refill on her Zofran . Please advise.

## 2023-08-31 ENCOUNTER — Encounter (HOSPITAL_COMMUNITY): Payer: Self-pay | Admitting: Clinical

## 2023-08-31 ENCOUNTER — Ambulatory Visit (INDEPENDENT_AMBULATORY_CARE_PROVIDER_SITE_OTHER): Admitting: Clinical

## 2023-08-31 DIAGNOSIS — F4310 Post-traumatic stress disorder, unspecified: Secondary | ICD-10-CM

## 2023-08-31 DIAGNOSIS — R41840 Attention and concentration deficit: Secondary | ICD-10-CM

## 2023-08-31 DIAGNOSIS — F331 Major depressive disorder, recurrent, moderate: Secondary | ICD-10-CM | POA: Diagnosis not present

## 2023-08-31 DIAGNOSIS — F411 Generalized anxiety disorder: Secondary | ICD-10-CM | POA: Diagnosis not present

## 2023-08-31 DIAGNOSIS — F410 Panic disorder [episodic paroxysmal anxiety] without agoraphobia: Secondary | ICD-10-CM

## 2023-08-31 NOTE — Progress Notes (Signed)
 THERAPIST PROGRESS NOTE  Session Time:  1:06pm-1:46pm  Session #19  Virtual Visit via Video Note  I connected with [name] on 08/31/23 at  1:00 PM EDT by a video enabled telemedicine application and verified that I am speaking with the correct person using two identifiers.  Location: Patient: private office at her workplace Provider:  home office   I discussed the limitations of evaluation and management by telemedicine and the availability of in person appointments. The patient expressed understanding and agreed to proceed.   I discussed the assessment and treatment plan with the patient. The patient was provided an opportunity to ask questions and all were answered. The patient agreed with the plan and demonstrated an understanding of the instructions.   The patient was advised to call back or seek an in-person evaluation if the symptoms worsen or if the condition fails to improve as anticipated.  I provided 40 minutes of non-face-to-face time during this encounter.  Sierra JINNY Crest, LCSW    Participation Level: Active  Behavioral Response: Well Groomed Alert Depressed and Irritable   Type of Therapy: Individual Therapy  Treatment Goals addressed:   STG: [name] will experience a reduction in exaggerated beliefs about self and others that interfere with trauma resolution as evidenced by self-report STG: Practice interpersonal effectiveness skills 3 times per week for the next 26 weeks (BH CCP Acute or Chronic Trauma Reaction) STG: [name] will practice conflict resolution skills at least 1 times per week for the next 26 weeks  STG: [name] will verbalize an increased sense of mastery over PTSD symptoms by using several techniques to cope with flashbacks, decrease the power of triggers, and decrease negative thinking  STG: Explore how core beliefs about self impact her negatively and positively, as well as how she can establish and maintain more  helpful boundaries in the future for a more balanced result. LTG: Provide options for patient to work on forgiveness, shame, sleep, relationship to food, or other issues as appropriate and as presents during sessions LTG: Score less than 9 on the PHQ-9 and less than 5 on the GAD-7 as evidenced by intermittent administration of the questionnaires to determine progress in managing depression and anxiety.  LTG: Identify and decrease cognitive distortions contributing negatively to mood and behavior by identifying 5-7 cognitive distortions that are present; learn how to come up with replacement thoughts that are more balanced, realistic, and helpful.  LTG: Learn and practice communication techniques such as active listening, I statements, open-ended questions, reflective listening, assertiveness, fair fighting rules, initiating conversations, and more as necessary and taught in session.  LTG: Learn about boundary styles and types, how to implement them, and how to enforce them so that feels more empowered and content with being able to maintain more helpful, appropriate boundaries in the future for a more balanced result.  LTG: Work to Arts development officer from models like CBT, Stages of Change, DBT, shame resilience theory, ACT, SFBT, MI, trauma-informed therapy and others to be able to manage mental health symptoms, AEB practicing out of session and reporting back.  STG: Process life events to the extent needed so that will be able to move forward with various areas of life in a better frame of mind per self-report.  STG: Learn emotion regulation strategies, distress tolerance skills, interpersonal effectiveness techniques, and mindfulness practices and use them in session and in life situations to improve results and satisfaction.  ProgressTowards Goals: Progressing  Interventions: Assertiveness Training, Supportive, and Other: shame work  Summary: [name] is a 39 y.o. female who presents  with depression and anxiety.  She presented oriented x5 and stated she was feeling high functioning despite my anxiety about something happening, tired of crying.  [name] evaluated patient's medication compliance, use of coping tools, and self-care, as applicable.  She provided an update on various aspects of her life that are normally discussed in therapy, including financial troubles caused by being taken off public assistance now that she is working, a man she has been dating the last week, and how she has changed in her responses.  She called what she has been going through with loss of food stamps and Medicaid, possible loss of Section 8 housing as a purgatory of stress.  When she lets herself feel that stress, she starts to panic and feels she is in shock.  She laments the way things have become harder for her financially rather than easier through working.  This was normalized for her as an argument that even political parties use to argue their side of debates over huge government proposals.  The last time she talked to Section 8, they were increasing her portion of rent to $1100, but her monthly pay is only $1200.  Their estimate also includes the child support they think she is getting from each of her children's fathers; however, one of them pays very little and only sporadically and the other is now sharing custody 50/50 so is not paying anything at all.  [name] suggested that she get in touch with the Section 8 housing office and update them to see what happens at that point, as it will surely have an impact, even if it just delays things while they do the research.  She was especially upset because when she talks to workers in such places, They treat me like a criminal.  She has been on several dates with a man over the past week and we discussed at length the red flags she is seeing.  She knows already she does not want to be with him but is continuing to make plans occasionally, was encouraged to make  a clean break in order to be true to herself.  She talked about feeling like she is the problem, that something is wrong with her, which was explored with her to point out that at least in this case, as well as in the case of her previous 2 relationships, this has not been the case.   She was encouraged to continue with positive affirmations about the new person she is becoming and that she can and does make mistakes, but this is not one of them.  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  Suicidal/Homicidal: No without intent  Therapist Response:   Patient is progressing AEB engaging in scheduled therapy session.  Throughout the session, [name] gave patient the opportunity to explore thoughts and feelings associated with current life situations and past/present stressors.   [name] challenged patient gently and appropriately to consider different ways of looking at reported issues. [name] encouraged patient's expression of feelings and validated these using empathy, active listening, open body language, and unconditional positive regard.   [name] scheduled another appointment.   Plan/Recommendations:  Return to therapy at next scheduled appointment on 8/13, do positive affirmations she can believe, particularly giving herself grace for not being perfect  Diagnosis:  Major depressive disorder, recurrent episode, moderate (HCC)  Generalized anxiety disorder with panic attacks  Poor concentration  Post  traumatic stress disorder  Collaboration of Care: Psychiatrist AEB - psychiatrist can read therapy notes; therapist can and does read psychiatric notes prior to sessions   Patient/Guardian was advised Release of Information must be obtained prior to any record release in order to collaborate their care with an outside provider. Patient/Guardian was advised if they have not already done so to contact the registration department to sign all necessary forms in order for us  to release  information regarding their care.   Consent: Patient/Guardian gives verbal consent for treatment and assignment of benefits for services provided during this visit. Patient/Guardian expressed understanding and agreed to proceed.   Sierra JINNY Crest, LCSW 08/31/2023

## 2023-09-21 ENCOUNTER — Encounter (HOSPITAL_COMMUNITY): Payer: Self-pay | Admitting: Clinical

## 2023-09-21 ENCOUNTER — Ambulatory Visit (HOSPITAL_COMMUNITY): Admitting: Clinical

## 2023-09-21 DIAGNOSIS — F411 Generalized anxiety disorder: Secondary | ICD-10-CM

## 2023-09-21 DIAGNOSIS — F331 Major depressive disorder, recurrent, moderate: Secondary | ICD-10-CM | POA: Diagnosis not present

## 2023-09-21 DIAGNOSIS — F410 Panic disorder [episodic paroxysmal anxiety] without agoraphobia: Secondary | ICD-10-CM

## 2023-09-21 DIAGNOSIS — R41840 Attention and concentration deficit: Secondary | ICD-10-CM

## 2023-09-21 DIAGNOSIS — F4310 Post-traumatic stress disorder, unspecified: Secondary | ICD-10-CM

## 2023-09-21 NOTE — Progress Notes (Signed)
 THERAPIST PROGRESS NOTE  Session Time:  8:05am-8:55am  Session #20  Virtual Visit via Video Note  I connected with [name] on 09/21/23 at  8:00 AM EDT by a video enabled telemedicine application and verified that I am speaking with the correct person using two identifiers.  Location: Patient: private office at her workplace Provider:  home office   I discussed the limitations of evaluation and management by telemedicine and the availability of in person appointments. The patient expressed understanding and agreed to proceed.   I discussed the assessment and treatment plan with the patient. The patient was provided an opportunity to ask questions and all were answered. The patient agreed with the plan and demonstrated an understanding of the instructions.   The patient was advised to call back or seek an in-person evaluation if the symptoms worsen or if the condition fails to improve as anticipated.  I provided 50 minutes of non-face-to-face time during this encounter.  Sierra JINNY Crest, LCSW    Participation Level: Active  Behavioral Response: Well Groomed Alert Depressed and Hopeless   Type of Therapy: Individual Therapy  Treatment Goals addressed:   STG: [name] will experience a reduction in exaggerated beliefs about self and others that interfere with trauma resolution as evidenced by self-report STG: Practice interpersonal effectiveness skills 3 times per week for the next 26 weeks  STG: [name] will practice conflict resolution skills at least 1 times per week for the next 26 weeks  STG: [name] will verbalize an increased sense of mastery over PTSD symptoms by using several techniques to cope with flashbacks, decrease the power of triggers, and decrease negative thinking  STG: Explore how core beliefs about self impact her negatively and positively, as well as how she can establish and maintain more helpful boundaries in the future for a more  balanced result. LTG: Provide options for patient to work on forgiveness, shame, sleep, relationship to food, or other issues as appropriate and as presents during sessions LTG: Score less than 9 on the PHQ-9 and less than 5 on the GAD-7 as evidenced by intermittent administration of the questionnaires to determine progress in managing depression and anxiety.  LTG: Identify and decrease cognitive distortions contributing negatively to mood and behavior by identifying 5-7 cognitive distortions that are present; learn how to come up with replacement thoughts that are more balanced, realistic, and helpful.  LTG: Learn and practice communication techniques such as active listening, I statements, open-ended questions, reflective listening, assertiveness, fair fighting rules, initiating conversations, and more as necessary and taught in session.  LTG: Learn about boundary styles and types, how to implement them, and how to enforce them so that feels more empowered and content with being able to maintain more helpful, appropriate boundaries in the future for a more balanced result.  LTG: Work to Arts development officer from models like CBT, Stages of Change, DBT, shame resilience theory, ACT, SFBT, MI, trauma-informed therapy and others to be able to manage mental health symptoms, AEB practicing out of session and reporting back.  STG: Process life events to the extent needed so that will be able to move forward with various areas of life in a better frame of mind per self-report.  STG: Learn emotion regulation strategies, distress tolerance skills, interpersonal effectiveness techniques, and mindfulness practices and use them in session and in life situations to improve results and satisfaction.  ProgressTowards Goals: Progressing  Interventions: CBT, Assertiveness Training, and Supportive   Summary: [name] is a  37 y.o. female who presents with depression and anxiety.  She presented oriented x5  and stated she was feeling stressed.  [name] evaluated patient's medication compliance, use of coping tools, and self-care, as applicable.  She provided an update on various aspects of her life that are normally discussed in therapy, including her unhappiness at her current workplace, starting nursing school, her dating life, and her frustrations with mother.  She disclosed a number of problems occurring in the work environment which she reports to be toxic and making her quite stressed and unhappy, to the point she can physically feel the tension when she walks in to work.  We explored various options including making a report to [employer], but she is leaning toward just leaving as others have done.  [name] provided support and encouragement that she makes good decisions and will be able to do so in this situation.  She is actively looking for a new position.  She is starting nursing school tomorrow and is paying for it herself rather than doing tuition reimbursement, because the decision makers at her current job have said not yet about her attending nursing school.  We discussed the benefit of not having to fulfill an obligation to an employer by paying for the schooling herself.  She shared that she did in fact stop dating the man discussed at last session, but is dating a couple of other men who are more in line with what she is looking for, specifically having an intention of a future with someone.  Finally, she expressed a great deal of frustration with mother who she and sister believe may have a gambling problem since she works 2 jobs a total of 70 hours a week but has lost 2 cars and does not have the ability to get herself a car currently.  She is relying on them for transportation and does not seem to take into consideration whether she is inconveniencing them such as making them lose sleep time.  We processed that patient is not obligated to her mother any longer and that she has a right to set up  the boundaries which she has done, may even be teaching her other family members by example.  She feels good about this.  Suicidal/Homicidal: No without intent  Therapist Response:   Patient is progressing AEB engaging in scheduled therapy session.  Throughout the session, [name] gave patient the opportunity to explore thoughts and feelings associated with current life situations and past/present stressors.   [name] challenged patient gently and appropriately to consider different ways of looking at reported issues. [name] encouraged patient's expression of feelings and validated these using empathy, active listening, open body language, and unconditional positive regard.   [name] scheduled another appointment.   Plan/Recommendations:  Return to therapy at next scheduled appointment on 9/10, consider making a report to HR as discussed in session, maintain her healthy boundaries  Diagnosis:  Major depressive disorder, recurrent episode, moderate (HCC)  Generalized anxiety disorder with panic attacks  Poor concentration  Post traumatic stress disorder  Collaboration of Care: Psychiatrist AEB - psychiatrist can read therapy notes; therapist can and does read psychiatric notes prior to sessions   Patient/Guardian was advised Release of Information must be obtained prior to any record release in order to collaborate their care with an outside provider. Patient/Guardian was advised if they have not already done so to contact the registration department to sign all necessary forms in order for us  to release information regarding their care.  Consent: Patient/Guardian gives verbal consent for treatment and assignment of benefits for services provided during this visit. Patient/Guardian expressed understanding and agreed to proceed.   Sierra JINNY Crest, LCSW 09/21/2023

## 2023-10-19 ENCOUNTER — Encounter (HOSPITAL_COMMUNITY): Payer: Self-pay

## 2023-10-19 ENCOUNTER — Ambulatory Visit (INDEPENDENT_AMBULATORY_CARE_PROVIDER_SITE_OTHER): Payer: Self-pay | Admitting: Clinical

## 2023-10-19 DIAGNOSIS — F331 Major depressive disorder, recurrent, moderate: Secondary | ICD-10-CM

## 2023-10-19 DIAGNOSIS — F410 Panic disorder [episodic paroxysmal anxiety] without agoraphobia: Secondary | ICD-10-CM

## 2023-10-19 DIAGNOSIS — F411 Generalized anxiety disorder: Secondary | ICD-10-CM

## 2023-10-19 NOTE — Progress Notes (Signed)
  [name]    [name] attempted to connect with patient for scheduled appointment via MyChart video text request x 2 and email request with no response; also attempted to connect via phone without success. [name] left message for patient to call office to reschedule therapy appointment.        Attempt 1: Text and email: 8:10am      Attempt 2: Text: 8:17am       Attempt 3: Phone call and HIPAA-compliant voicemail: 8:32am      Left video chat open until:  8:32am      Per [place] policy, after multiple attempts to reach patient unsuccessfully at appointed time, visit will be coded as a no show.    Encounter Diagnoses  Name Primary?   Major depressive disorder, recurrent episode, moderate (HCC) Yes   No-show for appointment    Generalized anxiety disorder with panic attacks        Elgie Crest, LCSW 10/19/2023, 8:32 AM

## 2023-11-07 ENCOUNTER — Ambulatory Visit (HOSPITAL_COMMUNITY): Admitting: Family

## 2023-11-16 ENCOUNTER — Ambulatory Visit (INDEPENDENT_AMBULATORY_CARE_PROVIDER_SITE_OTHER): Payer: Self-pay | Admitting: Clinical

## 2023-11-16 ENCOUNTER — Encounter (HOSPITAL_COMMUNITY): Payer: Self-pay | Admitting: Clinical

## 2023-11-16 DIAGNOSIS — F410 Panic disorder [episodic paroxysmal anxiety] without agoraphobia: Secondary | ICD-10-CM

## 2023-11-16 DIAGNOSIS — F411 Generalized anxiety disorder: Secondary | ICD-10-CM

## 2023-11-16 DIAGNOSIS — F331 Major depressive disorder, recurrent, moderate: Secondary | ICD-10-CM

## 2023-11-16 NOTE — Progress Notes (Unsigned)
 THERAPIST PROGRESS NOTE  Session Time:  8:05am-8:53am  Session #21  Virtual Visit via Video Note  I connected with [name] on 11/16/23 at  8:00 AM EDT by a video enabled telemedicine application and verified that I am speaking with the correct person using two identifiers.  Location: Patient: car then private area at work Provider:  home office   I discussed the limitations of evaluation and management by telemedicine and the availability of in person appointments. The patient expressed understanding and agreed to proceed.   I discussed the assessment and treatment plan with the patient. The patient was provided an opportunity to ask questions and all were answered. The patient agreed with the plan and demonstrated an understanding of the instructions.   The patient was advised to call back or seek an in-person evaluation if the symptoms worsen or if the condition fails to improve as anticipated.  I provided 48 minutes of non-face-to-face time during this encounter.  [name], LCSW    Participation Level: Active  Behavioral Response: Casual Alert Stressed   Type of Therapy: Individual Therapy  Treatment Goals addressed:   New treatment goals established, current goals reviewed:  LTG: Provide options for patient to work on forgiveness, shame, sleep, relationship to food, or other issues as appropriate and as presents during sessions  LTG: Score less than 9 on the PHQ-9 and less than 5 on the GAD-7 as evidenced by intermittent administration of the questionnaires to determine progress in managing depression and anxiety.  LTG: Identify and decrease cognitive distortions contributing negatively to mood and behavior by identifying 5-7 cognitive distortions that are present; learn how to come up with replacement thoughts that are more balanced, realistic, and helpful.  LTG: Learn and practice communication techniques such as active listening, I  statements, open-ended questions, reflective listening, assertiveness, fair fighting rules, initiating conversations, and more as necessary and taught in session.  LTG: Learn about boundary styles and types, how to implement them, and how to enforce them so that feels more empowered and content with being able to maintain more helpful, appropriate boundaries in the future for a more balanced result.  LTG: Work to Arts development officer from models like CBT, Stages of Change, DBT, shame resilience theory, ACT, SFBT, MI, trauma-informed therapy and others to be able to manage mental health symptoms, AEB practicing out of session and reporting back.  STG: Learn emotion regulation strategies, distress tolerance skills, interpersonal effectiveness techniques, and mindfulness practices and use them in session and in life situations to improve results and satisfaction.  STG: Process life events to the extent needed so that will be able to move forward with various areas of life in a better frame of mind per self-report of improved satisfaction with life 5 out of 7 days over the next 6 months.  STG: Explore personal core beliefs, rules and assumptions, and cognitive distortions through therapist using Cognitive Behavioral Therapy; learn about replacement thoughts, Behavioral Activation and Acting As If AEB using 2 times weekly. STG: Improve self-esteem by engaging in daily affirmations, developing new skills, gratitude journaling, use of SMART goals, increased assertiveness, challenging negative beliefs, and focusing on what patient can control  ProgressTowards Goals: Progressing  Interventions: Supportive and Other: treatment planning, assessment   Summary: [name] is a 38 y.o. female who presents with depression and anxiety.  She presented oriented x5 and stated she was feeling stressed out, I need to talk to you, a lot has been going on  but I haven't taken any Hydroxyzine  but came close.  [name]  evaluated patient's medication compliance, use of coping tools, and self-care, as applicable.  She provided an update on various aspects of her life that are normally discussed in therapy, including her feeling that she has been in a state of panic, but then started not to feel anything.  [name] explained that when we start to suppress our emotions, we are unable to dictate that only the negative ones are repressed, so the positive ones also get repressed.  She stated she is starting to feel better now that she has made decisions about distancing herself from family members.  She explained in detail what happened with her family members, mostly her aunt who paid for patient's daughter to go to a private school which then turned into a dramatic and unfortunate result. Her daughter is now back at her regular high school.  Patient said the same type of thing had happened to her when she was a teenager, so she feels that it is the right thing to do to distance herself from aunt and others in the family who sided with aunt.  She does not want her daughter to have to heal from the same stuff I did.  Patient also disclosed that she has forfeited her Section 8 housing voucher and moved in with her sister because she could not afford the new amount that Section 8 said she should pay monthly for her rent.  The amount they now want was based on her receipt of child support from her daughter's father, but he rarely actually pays this.  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  She also has started back to church and is getting baptised in November.  She does not like her mental health issues being reduced by her religious family to that's the devil however.  She continues to have much difficulty with attention/focus and would like to find someone to assess her for ADHD that accepts her insurance.  Suicidal/Homicidal: No without  intent  Therapist Response:   Patient is progressing AEB engaging in scheduled therapy session.  Throughout the session, [name] gave patient the opportunity to explore thoughts and feelings associated with current life situations and past/present stressors.   [name] challenged patient gently and appropriately to consider different ways of looking at reported issues. [name] encouraged patient's expression of feelings and validated these using empathy, active listening, open body language, and unconditional positive regard.   [name] scheduled another appointment and patient agreed to do groups in the meantime   Plan/Recommendations:  Return to therapy at next scheduled appointment on 12/19, reflect on what was discussed in session, engage in self care behaviors as explored in session, do homework as assigned (continue to set boundaries, call [facility] to see if they can do the ADHD assessment), and return to next session prepared to talk about experience with new coping methods.   Diagnosis:  Major depressive disorder, recurrent episode, moderate (HCC)  Generalized anxiety disorder with panic attacks  Collaboration of Care: Psychiatrist AEB - psychiatrist can read therapy notes; therapist can and does read psychiatric notes prior to sessions   Patient/Guardian was advised Release of Information must be obtained prior to any record release in order to collaborate their care with an outside provider. Patient/Guardian was advised if they have not already done so to contact the registration department to sign all necessary forms  in order for us  to release information regarding their care.   Consent: Patient/Guardian gives verbal consent for treatment and assignment of benefits for services provided during this visit. Patient/Guardian expressed understanding and agreed to proceed.   [name], LCSW 11/16/2023

## 2023-11-30 ENCOUNTER — Ambulatory Visit (INDEPENDENT_AMBULATORY_CARE_PROVIDER_SITE_OTHER): Payer: Self-pay | Admitting: Clinical

## 2023-11-30 DIAGNOSIS — F411 Generalized anxiety disorder: Secondary | ICD-10-CM

## 2023-11-30 DIAGNOSIS — F410 Panic disorder [episodic paroxysmal anxiety] without agoraphobia: Secondary | ICD-10-CM

## 2023-11-30 DIAGNOSIS — F331 Major depressive disorder, recurrent, moderate: Secondary | ICD-10-CM

## 2023-11-30 NOTE — Progress Notes (Signed)
 GROUP THERAPY PROGRESS NOTE  Session Date:  11/30/2023   Time:  5:00pm-6:00pm  Virtual Visit via Video Note  I connected withNAME@ and other group members on 11/30/23 at  5:00 PM EDT by a video enabled telemedicine application and verified that I am speaking with the correct person using two identifiers.  Location: Patient: Home Provider: [facility] outpatient therapy office - [place]   I provided 60 minutes of non-face-to-face time during this encounter.   Participation Level: Active  Behavioral Response: Casual Alert Irritable  Type of Therapy: Group Therapy  Treatment Goals addressed:  Receive mental health support from [name] and group members while also providing the same to other group members; learn to empathize with others in their similar but different issues; be exposed to other ways to look at issues; learn new coping methods; express feelings and needs in an honest/open/willing format; become more accepting of life challenges by seeing they are a common experience.  ProgressTowards Goals: Progressing  Interventions: CBT, Solution Focused, and Supportive  Summary: Patient shared the challenges she has been facing at work and how this is making her hate going to work.  She loves the work she does but the people are frustrating because they talk about each other and are negative, to the extent that many co-workers have departed, which she also wants to do.  She recently stood up to one of these gossips and also told the supervisor, but those two individuals are friends so she does not anticipate there being any changes.  She did engage in cognitive distortions especially mind reading, which was pointed out to the group and accepted well.  While she received support in the group from [name] and other participants, she was also open to the comments about changes she might want to consider.  Suicidal/Homicidal: No without intent/plan  Therapist Response: At appropriate times, CBT was  used to challenge statements made.  Patients were open to being shown where their experiences were similar.  Gentle redirection and reframing were provided and group participants were given the opportunity to help each other with this also.  [name] explained how this group supplements their experience in individual sessions during a time when they cannot be seen in a timely manner.     Plan/Recommendations:  Return to individual therapy and/or group therapy at next scheduled appointment(s), reflect on what was discussed in group, engage in self care behaviors as explored, and return to next appointment prepared to talk about experience with new coping methods.   Diagnosis:  Encounter Diagnoses  Name Primary?   Major depressive disorder, recurrent episode, moderate (HCC) Yes   Generalized anxiety disorder with panic attacks       Collaboration of Care: Psychiatrist AEB - can see patient attended group therapy  Patient/Guardian was advised Release of Information must be obtained prior to any record release in order to collaborate their care with an outside provider. Patient/Guardian was advised if they have not already done so to contact the registration department to sign all necessary forms in order for us  to release information regarding their care.   Consent: Patient/Guardian gives verbal consent for treatment and assignment of benefits for services provided during this visit. Patient/Guardian expressed understanding and agreed to proceed.   [name], LCSW 12/03/2023

## 2023-12-03 ENCOUNTER — Encounter (HOSPITAL_COMMUNITY): Payer: Self-pay | Admitting: Clinical

## 2023-12-14 ENCOUNTER — Emergency Department (HOSPITAL_BASED_OUTPATIENT_CLINIC_OR_DEPARTMENT_OTHER)
Admission: EM | Admit: 2023-12-14 | Discharge: 2023-12-14 | Disposition: A | Discharge status: Home / Self Care | Payer: Self-pay | Attending: Emergency Medicine | Admitting: Emergency Medicine

## 2023-12-14 ENCOUNTER — Encounter (HOSPITAL_COMMUNITY): Payer: Self-pay

## 2023-12-14 ENCOUNTER — Encounter (HOSPITAL_BASED_OUTPATIENT_CLINIC_OR_DEPARTMENT_OTHER): Payer: Self-pay | Admitting: Emergency Medicine

## 2023-12-14 ENCOUNTER — Other Ambulatory Visit: Payer: Self-pay

## 2023-12-14 ENCOUNTER — Ambulatory Visit (HOSPITAL_COMMUNITY): Payer: Self-pay | Admitting: Clinical

## 2023-12-14 DIAGNOSIS — G43909 Migraine, unspecified, not intractable, without status migrainosus: Secondary | ICD-10-CM | POA: Insufficient documentation

## 2023-12-14 DIAGNOSIS — I10 Essential (primary) hypertension: Secondary | ICD-10-CM | POA: Insufficient documentation

## 2023-12-14 MED ORDER — ACETAMINOPHEN 500 MG PO TABS
1000.0000 mg | ORAL_TABLET | Freq: Once | ORAL | Status: AC
  Administered 2023-12-14: 1000 mg via ORAL
  Filled 2023-12-14: qty 2

## 2023-12-14 MED ORDER — METOCLOPRAMIDE HCL 5 MG/ML IJ SOLN
10.0000 mg | Freq: Once | INTRAMUSCULAR | Status: AC
  Administered 2023-12-14: 10 mg via INTRAVENOUS
  Filled 2023-12-14: qty 2

## 2023-12-14 MED ORDER — KETOROLAC TROMETHAMINE 15 MG/ML IJ SOLN
15.0000 mg | Freq: Once | INTRAMUSCULAR | Status: AC
  Administered 2023-12-14: 15 mg via INTRAVENOUS
  Filled 2023-12-14: qty 1

## 2023-12-14 NOTE — Discharge Instructions (Addendum)
 You were seen in the emergency department for your headache.  This appeared consistent with migraine headaches and improved with medication in the emergency department.  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  Your blood pressure was slightly elevated in the emergency department did improve with pain control and would continue to monitor this at home and have it followed up with your primary doctor.  You should return to the emergency department for significantly worsening pain, changes in vision, numbness or weakness, repetitive vomiting, seizures, and drowsy and difficult to wake up or any other new or concerning symptoms.

## 2023-12-14 NOTE — ED Triage Notes (Signed)
 Reports headache x 1 week. Hx of pseudotumor Endorses neck stiffness Denies fever. Also reports cracked tooth on left side.

## 2023-12-14 NOTE — ED Provider Notes (Signed)
[place] EMERGENCY DEPARTMENT AT [facility] Provider Note   CSN: [id number] Arrival date & time: 12/14/23  9244     Patient presents with: Headache   [name] is a 38 y.o. female.   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  The patient states over the last few days that she has had left-sided headache behind her left eye that feels similar to her prior migraines in the past.  She states that she has been off her IIH medication since March so is unsure if this could be related to that as well.  She denies any associated vision changes but does endorse some photophobia.  She denies any numbness or weakness.  Reports some nausea but denies any vomiting.  She states that she tried taking Tylenol  and Motrin  yesterday without significant relief.  The history is provided by the patient.  Headache      Prior to Admission medications   Medication Sig Start Date End Date Taking? Authorizing Provider  predniSONE  (STERAPRED UNI-PAK 21 TAB) 10 MG (21) TBPK tablet Use as directed per package instructions. 07/11/23   Zehr, Jessica D, PA-C  chlorthalidone  (HYGROTON ) 25 MG tablet Take 1 tablet (25 mg total) by mouth daily. *pt needs appt* 05/03/23   Joshua Cathryne BROCKS, MD  FLUoxetine  (PROZAC ) 10 MG capsule Take 1 capsule (10 mg total) by mouth daily. 05/04/23   Ezzard Staci SAILOR, NP  hydrocortisone  (ANUSOL -HC) 25 MG suppository Place 1 suppository (25 mg total) rectally at bedtime. 06/23/23   Zehr, Jessica D, PA-C  hydrOXYzine  (ATARAX ) 10 MG tablet Take 1 tablet (10 mg total) by mouth 3 (three) times daily as needed. 12/06/22   Ezzard Staci SAILOR, NP  neomycin -polymyxin b -dexamethasone  (MAXITROL ) 3.5-10000-0.1 SUSP Apply 1 drop into affected eye three times a day Shake bottle before each use. 05/03/23     norelgestromin -ethinyl  estradiol  (XULANE) 150-35 MCG/24HR transdermal patch Apply 1 patch to skin EVERY WEEK  x 3 weeks, rest x 1 wk, restart next cycle 07/22/23     ondansetron  (ZOFRAN -ODT) 4 MG disintegrating tablet Take 1 tablet (4 mg total) by mouth every 8 (eight) hours as needed for nausea or vomiting. 08/03/23   Zehr, Jessica D, PA-C  pantoprazole  (PROTONIX ) 40 MG tablet Take 1 tablet (40 mg total) by mouth daily. 05/03/23   Joshua Cathryne BROCKS, MD  TWIRLA  120-30 MCG/24HR PTWK Place 1 each onto the skin once a week. 03/17/23   Joshua Cathryne BROCKS, MD    Allergies: Amoxicillin and Tape    Review of Systems  Neurological:  Positive for headaches.    Updated Vital Signs BP (!) 165/112 (BP Location: Right Arm)   Pulse 81   Temp 98.7 F (37.1 C) (Oral)   Resp 18   SpO2 99%   Physical Exam Vitals and nursing note reviewed.  Constitutional:      General: She is not in acute distress.    Appearance: She is well-developed.  HENT:     Head: Normocephalic and atraumatic.     Mouth/Throat:     Mouth: Mucous membranes are moist.     Pharynx: Oropharynx is clear.  Eyes:     Extraocular Movements: Extraocular movements intact.     Pupils: Pupils are equal, round, and reactive to light.  Cardiovascular:     Rate and Rhythm: Normal rate and regular rhythm.  Heart sounds: Normal heart sounds.  Pulmonary:     Effort: Pulmonary effort is normal.     Breath sounds: Normal breath sounds.  Abdominal:     Palpations: Abdomen is soft.     Tenderness: There is no abdominal tenderness.  Musculoskeletal:        General: Normal range of motion.     Cervical back: Normal range of motion and neck supple. No rigidity.  Skin:    General: Skin is warm and dry.  Neurological:     Mental Status: She is alert and oriented to person, place, and time.     GCS: GCS eye subscore is 4. GCS verbal subscore is 5. GCS motor subscore is 6.     Cranial Nerves: No cranial nerve deficit or dysarthria.     Sensory: No sensory deficit.      Motor: No weakness.  Psychiatric:        Mood and Affect: Mood normal.        Speech: Speech normal.     (all labs ordered are listed, but only abnormal results are displayed) Labs Reviewed - No data to display  EKG: None  Radiology: No results found.   Procedures   Medications Ordered in the ED  ketorolac (TORADOL) 15 MG/ML injection 15 mg (15 mg Intravenous Given 12/14/23 0904)  metoCLOPramide  (REGLAN ) injection 10 mg (10 mg Intravenous Given 12/14/23 0904)  acetaminophen  (TYLENOL ) tablet 1,000 mg (1,000 mg Oral Given 12/14/23 0904)    Clinical Course as of 12/14/23 1007  Wed Dec 14, 2023  1006 Upon reassessment, headache improved and blood pressures improved to 130s.  She stable for discharge home with outpatient follow-up. [VK]    Clinical Course User Index [VK] Kingsley, Aeriel Boulay K, DO                                 Medical Decision Making This patient presents to the ED with chief complaint(s) of headache with pertinent past medical history of IIH, migraine which further complicates the presenting complaint. The complaint involves an extensive differential diagnosis and also carries with it a high risk of complications and morbidity.    The differential diagnosis includes IIH, migraine, tension headache, no focal neurologic deficits and similar to prior headaches in the past and making ICH or mass effect unlikely, no fever or meningismus making meningitis unlikely, denies any chance of pregnancy making preeclampsia unlikely  Additional history obtained: Additional history obtained from N/A Records reviewed Care Everywhere/External Records and Primary Care Documents  ED Course and Reassessment: On patient's arrival he was mildly hypertensive and otherwise hemodynamically stable in no acute distress without any focal neurologic deficits.  Patient will be treated with migraine cocktail for present migraine headache and will be closely reassessed.  Independent labs  interpretation:  N/A  Independent visualization of imaging: -N/A  Consultation: - Consulted or discussed management/test interpretation w/ external professional: N/A  Consideration for admission or further workup: Patient has no emergent conditions requiring admission or further work-up at this time and is stable for discharge home with primary care and neurology follow-up  Social Determinants of health: N/A    Risk OTC drugs. Prescription drug management.       Final diagnoses:  Migraine without status migrainosus, not intractable, unspecified migraine type    ED Discharge Orders     None          Kingsley, Malyna Budney K, DO 12/14/23  1007  

## 2023-12-15 NOTE — Progress Notes (Signed)
 Group Therapy Progress Note  Patient had a virtual appointment scheduled with therapist on 12/15/2023  at 5:00pm for group therapy.  She did not attend the group session.  As per [facility] policy, this will be coded as a no-show appointment.    Encounter Diagnosis  Name Primary?   No-show for appointment Yes     Elgie Crest, LCSW 12/15/2023, 10:11 AM

## 2023-12-20 ENCOUNTER — Encounter: Payer: Self-pay | Admitting: Neurology

## 2024-01-10 ENCOUNTER — Other Ambulatory Visit: Payer: Self-pay

## 2024-01-10 ENCOUNTER — Ambulatory Visit
Admission: RE | Admit: 2024-01-10 | Discharge: 2024-01-10 | Disposition: A | Payer: Self-pay | Source: Ambulatory Visit | Attending: Gastroenterology

## 2024-01-10 DIAGNOSIS — R1084 Generalized abdominal pain: Secondary | ICD-10-CM

## 2024-01-10 DIAGNOSIS — K5900 Constipation, unspecified: Secondary | ICD-10-CM

## 2024-01-11 ENCOUNTER — Ambulatory Visit: Payer: Self-pay | Admitting: Gastroenterology

## 2024-01-27 ENCOUNTER — Encounter (HOSPITAL_COMMUNITY): Payer: Self-pay

## 2024-01-27 ENCOUNTER — Ambulatory Visit (HOSPITAL_COMMUNITY): Payer: Self-pay | Admitting: Clinical

## 2024-01-27 NOTE — Progress Notes (Signed)
 Marcela JONETTA Louder    [name] attempted to connect with patient for scheduled appointment via MyChart video text request x 2 and email request with no response; also attempted to connect via phone without success. [name] left message for patient to call office to reschedule therapy appointment.        Attempt 1: Text and email: 10:03am      Attempt 2: Text: 10:08am       Attempt 3: Phone call and HIPAA-compliant voicemail: 10:14am      Left video chat open until:  10:19am      Per [facility] policy, after multiple attempts to reach patient unsuccessfully at appointed time, visit will be coded as a no show.    Encounter Diagnosis  Name Primary?   No-show for appointment Yes       Elgie Crest, LCSW 01/27/2024, 10:19 AM

## 2024-02-14 ENCOUNTER — Other Ambulatory Visit: Payer: Self-pay | Admitting: Family Medicine

## 2024-02-14 ENCOUNTER — Other Ambulatory Visit (HOSPITAL_COMMUNITY): Payer: Self-pay

## 2024-02-14 DIAGNOSIS — I10 Essential (primary) hypertension: Secondary | ICD-10-CM

## 2024-02-16 NOTE — Telephone Encounter (Signed)
 Requested medication (s) are due for refill today: yes  Requested medication (s) are on the active medication list: yes  Last refill:  05/03/23  Future visit scheduled: yes  Notes to clinic:  Unable to refill per protocol, courtesy refill already given, routing for provider approval.      Requested Prescriptions  Pending Prescriptions Disp Refills   chlorthalidone  (HYGROTON ) 25 MG tablet 90 tablet 1    Sig: Take 1 tablet (25 mg total) by mouth daily. *pt needs appt*     Cardiovascular: Diuretics - Thiazide Failed - 02/16/2024  9:17 AM      Failed - Cr in normal range and within 180 days    Creatinine, Ser  Date Value Ref Range Status  03/14/2023 0.85 0.57 - 1.00 mg/dL Final   Creatinine, Urine  Date Value Ref Range Status  01/01/2015 303.00 mg/dL Final         Failed - K in normal range and within 180 days    Potassium  Date Value Ref Range Status  05/03/2023 3.7 3.5 - 5.2 mmol/L Final         Failed - Na in normal range and within 180 days    Sodium  Date Value Ref Range Status  03/14/2023 138 134 - 144 mmol/L Final         Failed - Last BP in normal range    BP Readings from Last 1 Encounters:  12/14/23 (!) 165/112         Failed - Valid encounter within last 6 months    Recent Outpatient Visits           9 months ago Primary hypertension   [facility] Joshua Cathryne JAYSON, MD   11 months ago Pre-op examination   [facility] Joshua Cathryne JAYSON, MD       Future Appointments             In 4 weeks Skeet Juliene SAUNDERS, DO [facility] Neurology

## 2024-02-21 ENCOUNTER — Other Ambulatory Visit (HOSPITAL_COMMUNITY): Payer: Self-pay

## 2024-02-21 MED ORDER — NORELGESTROMIN-ETH ESTRADIOL 150-35 MCG/24HR TD PTWK
1.0000 | MEDICATED_PATCH | TRANSDERMAL | 4 refills | Status: AC
  Filled 2024-02-21: qty 3, 21d supply, fill #0

## 2024-02-21 MED ORDER — PHENTERMINE HCL 37.5 MG PO TABS
37.5000 mg | ORAL_TABLET | Freq: Every day | ORAL | 1 refills | Status: AC
  Filled 2024-02-21: qty 90, 90d supply, fill #0

## 2024-02-23 ENCOUNTER — Other Ambulatory Visit (HOSPITAL_COMMUNITY): Payer: Self-pay

## 2024-02-23 MED ORDER — VITAMIN D 1.25 MG (50000 UT) PO CAPS
ORAL_CAPSULE | ORAL | 0 refills | Status: AC
  Filled 2024-02-23: qty 13, 91d supply, fill #0

## 2024-02-24 ENCOUNTER — Encounter: Payer: Self-pay | Admitting: Student

## 2024-02-27 ENCOUNTER — Encounter: Payer: Self-pay | Admitting: Student

## 2024-02-28 ENCOUNTER — Institutional Professional Consult (permissible substitution): Admitting: Plastic Surgery

## 2024-03-01 ENCOUNTER — Encounter: Payer: Self-pay | Admitting: Plastic Surgery

## 2024-03-01 ENCOUNTER — Ambulatory Visit: Admitting: Plastic Surgery

## 2024-03-01 VITALS — BP 133/97 | HR 94 | Ht 65.0 in | Wt 175.6 lb

## 2024-03-01 DIAGNOSIS — N62 Hypertrophy of breast: Secondary | ICD-10-CM | POA: Diagnosis not present

## 2024-03-01 DIAGNOSIS — M545 Low back pain, unspecified: Secondary | ICD-10-CM

## 2024-03-01 DIAGNOSIS — N6489 Other specified disorders of breast: Secondary | ICD-10-CM

## 2024-03-01 DIAGNOSIS — M542 Cervicalgia: Secondary | ICD-10-CM

## 2024-03-01 DIAGNOSIS — M546 Pain in thoracic spine: Secondary | ICD-10-CM

## 2024-03-01 NOTE — Progress Notes (Signed)
 "   Referring Provider Sierra Rom, MD [address] [place],  [place] [address]   CC:  Chief Complaint  Patient presents with   Consult      [name] is an 39 y.o. female.  HPI: [name] is a 39 year old female who presents today with complaints of upper back and neck pain secondary to large breast size of several years duration.  The patient states that her bra straps frequently cut into her shoulders exacerbating the upper back and neck pain.  Additionally she has begun an exercise program that includes running.  She cannot find bras which sufficiently hold her breasts stationary while running also contributing to her back pain and chest pain.  She has a marked asymmetry of the breasts which also make finding bras that fit appropriately more difficult.  She is interested in surgical reduction of the size of the breast.  She denies any other significant medical problems and is not on blood thinners has never had a DVT.  She did have a mammogram in January 2025 secondary to a palpable abnormality in the right breast.  Mammogram was unremarkable and she was return to routine screening.  Allergies[1]  Outpatient Encounter Medications as of 03/01/2024  Medication Sig   chlorthalidone  (HYGROTON ) 25 MG tablet Take 1 tablet (25 mg total) by mouth daily. *pt needs appt*   Cholecalciferol  (VITAMIN D ) 1.25 MG (50000 UT) CAPS Take 1 capsule every week by oral route for 90 days.   FLUoxetine  (PROZAC ) 10 MG capsule Take 1 capsule (10 mg total) by mouth daily.   hydrOXYzine  (ATARAX ) 10 MG tablet Take 1 tablet (10 mg total) by mouth 3 (three) times daily as needed.   norelgestromin -ethinyl estradiol  (XULANE) 150-35 MCG/24HR transdermal patch Apply 1 patch to skin EVERY WEEK  x 3 weeks, rest x 1 wk, restart next cycle   norelgestromin -ethinyl estradiol  (XULANE) 150-35 MCG/24HR transdermal patch Place 1 patch onto the skin once a week.   ondansetron  (ZOFRAN -ODT) 4 MG  disintegrating tablet Take 1 tablet (4 mg total) by mouth every 8 (eight) hours as needed for nausea or vomiting.   pantoprazole  (PROTONIX ) 40 MG tablet Take 1 tablet (40 mg total) by mouth daily.   phentermine  (ADIPEX-P ) 37.5 MG tablet Take 1 tablet (37.5 mg total) by mouth daily.   predniSONE  (STERAPRED UNI-PAK 21 TAB) 10 MG (21) TBPK tablet Use as directed per package instructions.   hydrocortisone  (ANUSOL -HC) 25 MG suppository Place 1 suppository (25 mg total) rectally at bedtime.   neomycin -polymyxin b -dexamethasone  (MAXITROL ) 3.5-10000-0.1 SUSP Apply 1 drop into affected eye three times a day Shake bottle before each use.   TWIRLA  120-30 MCG/24HR PTWK Place 1 each onto the skin once a week. (Patient not taking: Reported on 03/01/2024)   No facility-administered encounter medications on file as of 03/01/2024.     Past Medical History:  Diagnosis Date   Anemia    Anxiety    Asthma    Chlamydia    Depression    suicide att in 2007   Hx of varicella    Normal pregnancy in multigravida in third trimester 01/09/2015   Pseudotumor cerebri    PTSD (post-traumatic stress disorder)     Past Surgical History:  Procedure Laterality Date   DILATION AND CURETTAGE OF UTERUS     TONSILLECTOMY     WISDOM TOOTH EXTRACTION      Family History  Problem Relation Age of Onset   Heart disease Mother  Hypertension Mother    Cancer Father    Depression Maternal Aunt    Hypertension Maternal Aunt    Stroke Maternal Aunt    Hypertension Maternal Grandmother    Alcohol abuse Neg Hx    Arthritis Neg Hx    Asthma Neg Hx    Birth defects Neg Hx    COPD Neg Hx    Diabetes Neg Hx    Drug abuse Neg Hx    Early death Neg Hx    Hearing loss Neg Hx    Hyperlipidemia Neg Hx    Kidney disease Neg Hx    Learning disabilities Neg Hx    Mental illness Neg Hx    Mental retardation Neg Hx    Miscarriages / Stillbirths Neg Hx    Vision loss Neg Hx    Varicose Veins Neg Hx     Social History    Social History Narrative   Not on file     Review of Systems General: Denies fevers, chills, weight loss CV: Denies chest pain, shortness of breath, palpitations Breast: Large and asymmetric breasts which cause difficulty wearing bras.  They also cause increased upper back and neck pain.  Physical Exam    03/01/2024   10:15 AM 12/14/2023    8:06 AM 05/04/2023    9:47 AM  Vitals with BMI  Height 5' 5    Weight 175 lbs 10 oz    BMI 29.22    Systolic 133 165   Diastolic 97 112   Pulse 94 81      Information is confidential and restricted. Go to Review Flowsheets to unlock data.    General:  No acute distress,  Alert and oriented, Non-Toxic, Normal speech and affect Breast: Breast asymmetry with the left breast markedly larger than the right.  She has grade 2 ptosis bilaterally.  I do not palpate any abnormalities on physical examination the nipples are normal in appearance and there is no evidence of nipple discharge today.  The sternal notch to nipple distance on the right is 30 cm and 35 cm on the left the nipple to fold distance on the right is 12 cm and 14 cm on the left Mammogram: As noted above a mammogram performed in January 2025 was BI-RADS 1 Assessment/Plan Symptomatic macromastia and breast asymmetry: Patient has large breast especially large breast and I believe she would benefit from a bilateral breast reduction.  I believe that I can remove 400 g from the right breast and 600 g from the left breast.  Reduction would also be performed for balancing to make it easier for her to find bras which fit appropriately.  We discussed breast reductions at length.  I showed her the location of the incisions and we discussed the unpredictable nature of scarring and wound healing.  We discussed risk of bleeding, infection, and seroma formation.  We discussed the risk of nipple loss due to nipple ischemia.  She understands I will use drains  and she will need to wear a supportive  compressive garment for 6 weeks postoperatively.  The postoperative limitations include no heavy lifting greater than 20 pounds, no vigorous activity, no submerging incisions in water for 6 weeks.  She may return to light activity as tolerated and will be encouraged to begin ambulation immediately after surgery to help decrease the risk of DVT.  All questions were answered to her satisfaction.  Photographs were obtained today with her consent.  Will submit her for a bilateral breast  reduction at her request.  She will need preoperative physical therapy prior to authorization by her insurance company.  Leonce KATHEE Birmingham 03/01/2024, 12:46 PM          [1]  Allergies Allergen Reactions   Amoxicillin Itching   Tape Rash   "

## 2024-03-05 ENCOUNTER — Other Ambulatory Visit (HOSPITAL_COMMUNITY): Payer: Self-pay

## 2024-03-15 NOTE — Progress Notes (Unsigned)
 "  NEUROLOGY CONSULTATION NOTE  [name] MRN: [id number] DOB: 01-Mar-1985  Referring provider: *** Primary care provider: ***  Reason for consult:  idiopathic intracranial hypertension  Assessment/Plan:   ***   Subjective:  [name] is a 39 year old ***-handed female with PTSD who presents for idiopathic intracranial hypertension.  History supplemented by prior neurologist's notes.  She started experiencing headaches and blurred vision in 2022.  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  MRI and MRV of brain with and without contrast on 12/29/2020 revealed narrowing of the distal transverse sinuses bilaterally but otherwise unremarkable.  Started topiramate which improved headaches.  She continued having blurred vision which resolved after an LP in July 2023 which revealed opening pressure of 38 mL water.  Diamox was added to her topiramate.  ***.  When she ran out of Diamox in June 2024, she didn't request refills and she began having increased headaches, blurred vision and pulsatile tinnitus.  She saw neurology in August 2024 and was restarted on Diamox 750mg  twice daily.       PAST MEDICAL HISTORY: Past Medical History:  Diagnosis Date   Anemia    Anxiety    Asthma    Chlamydia    Depression    suicide att in 2007   Hx of varicella    Normal pregnancy in multigravida in third trimester 01/09/2015   Pseudotumor cerebri    PTSD (post-traumatic stress disorder)     PAST SURGICAL HISTORY: Past Surgical History:  Procedure Laterality Date   DILATION AND CURETTAGE OF UTERUS     TONSILLECTOMY     WISDOM TOOTH EXTRACTION      MEDICATIONS: Medications Ordered Prior to Encounter[1]  ALLERGIES: Allergies[2]  FAMILY HISTORY: Family History  Problem Relation Age of Onset   Heart disease Mother    Hypertension Mother    Cancer Father    Depression Maternal Aunt    Hypertension Maternal Aunt    Stroke Maternal Aunt     Hypertension Maternal Grandmother    Alcohol abuse Neg Hx    Arthritis Neg Hx    Asthma Neg Hx    Birth defects Neg Hx    COPD Neg Hx    Diabetes Neg Hx    Drug abuse Neg Hx    Early death Neg Hx    Hearing loss Neg Hx    Hyperlipidemia Neg Hx    Kidney disease Neg Hx    Learning disabilities Neg Hx    Mental illness Neg Hx    Mental retardation Neg Hx    Miscarriages / Stillbirths Neg Hx    Vision loss Neg Hx    Varicose Veins Neg Hx     Objective:  *** General: No acute distress.  Patient appears well-groomed.   Head:  Normocephalic/atraumatic Eyes:  fundi examined but not visualized Neck: supple, no paraspinal tenderness, full range of motion Heart: regular rate and rhythm Neurological Exam: Mental status: alert and oriented to person, place, and time, speech fluent and not dysarthric, language intact. Cranial nerves: CN I: not tested CN II: pupils equal, round and reactive to light, visual fields intact CN III, IV, VI:  full range of motion, no nystagmus, no ptosis CN V: facial sensation intact. CN VII: upper and lower face symmetric CN VIII: hearing intact CN IX, X: gag intact, uvula midline CN XI: sternocleidomastoid and trapezius muscles intact CN XII: tongue midline Bulk & Tone: normal, no fasciculations. Motor:  muscle  strength 5/5 throughout Sensation:  Pinprick and vibratory sensation intact. Deep Tendon Reflexes:  2+ throughout,  toes downgoing.   Finger to nose testing:  Without dysmetria.   Gait:  Normal station and stride.  Romberg negative.    Juliene Dunnings, DO  CC: ***        [1]  Current Outpatient Medications on File Prior to Visit  Medication Sig Dispense Refill   predniSONE  (STERAPRED UNI-PAK 21 TAB) 10 MG (21) TBPK tablet Use as directed per package instructions. 21 tablet 0   chlorthalidone  (HYGROTON ) 25 MG tablet Take 1 tablet (25 mg total) by mouth daily. *pt needs appt* 90 tablet 1   Cholecalciferol  (VITAMIN D ) 1.25 MG (50000 UT)  CAPS Take 1 capsule every week by oral route for 90 days. 13 capsule 0   FLUoxetine  (PROZAC ) 10 MG capsule Take 1 capsule (10 mg total) by mouth daily. 90 capsule 1   hydrocortisone  (ANUSOL -HC) 25 MG suppository Place 1 suppository (25 mg total) rectally at bedtime. 7 suppository 1   hydrOXYzine  (ATARAX ) 10 MG tablet Take 1 tablet (10 mg total) by mouth 3 (three) times daily as needed. 30 tablet 0   neomycin -polymyxin b -dexamethasone  (MAXITROL ) 3.5-10000-0.1 SUSP Apply 1 drop into affected eye three times a day Shake bottle before each use. 5 mL 0   norelgestromin -ethinyl estradiol  (XULANE) 150-35 MCG/24HR transdermal patch Apply 1 patch to skin EVERY WEEK  x 3 weeks, rest x 1 wk, restart next cycle 9 patch 4   norelgestromin -ethinyl estradiol  (XULANE) 150-35 MCG/24HR transdermal patch Place 1 patch onto the skin once a week. 12 patch 4   ondansetron  (ZOFRAN -ODT) 4 MG disintegrating tablet Take 1 tablet (4 mg total) by mouth every 8 (eight) hours as needed for nausea or vomiting. 20 tablet 0   pantoprazole  (PROTONIX ) 40 MG tablet Take 1 tablet (40 mg total) by mouth daily. 90 tablet 1   phentermine  (ADIPEX-P ) 37.5 MG tablet Take 1 tablet (37.5 mg total) by mouth daily. 90 tablet 1   TWIRLA  120-30 MCG/24HR PTWK Place 1 each onto the skin once a week. (Patient not taking: Reported on 03/01/2024) 3 patch 3   No current facility-administered medications on file prior to visit.  [2]  Allergies Allergen Reactions   Amoxicillin Itching   Tape Rash   "

## 2024-03-16 ENCOUNTER — Ambulatory Visit: Payer: Self-pay | Admitting: Neurology

## 2024-03-16 ENCOUNTER — Encounter: Payer: Self-pay | Admitting: Neurology

## 2024-03-26 ENCOUNTER — Ambulatory Visit
